# Patient Record
Sex: Female | Born: 1980 | Race: White | Hispanic: No | Marital: Married | Smoking: Never smoker
Health system: Southern US, Community
[De-identification: ages and names within clinical notes are randomized; demographics above are authoritative.]

## PROBLEM LIST (undated history)

## (undated) DIAGNOSIS — K259 Gastric ulcer, unspecified as acute or chronic, without hemorrhage or perforation: Secondary | ICD-10-CM

## (undated) DIAGNOSIS — D649 Anemia, unspecified: Secondary | ICD-10-CM

## (undated) DIAGNOSIS — M797 Fibromyalgia: Secondary | ICD-10-CM

## (undated) HISTORY — DX: Fibromyalgia: M79.7

## (undated) HISTORY — PX: DILATION AND CURETTAGE OF UTERUS: SHX78

## (undated) HISTORY — PX: CHOLECYSTECTOMY: SHX55

## (undated) HISTORY — DX: Anemia, unspecified: D64.9

## (undated) HISTORY — PX: GASTRIC BYPASS: SHX52

---

## 2003-11-01 ENCOUNTER — Emergency Department (HOSPITAL_COMMUNITY): Admission: EM | Admit: 2003-11-01 | Discharge: 2003-11-02 | Payer: Self-pay | Admitting: Emergency Medicine

## 2003-11-22 ENCOUNTER — Emergency Department (HOSPITAL_COMMUNITY): Admission: EM | Admit: 2003-11-22 | Discharge: 2003-11-22 | Payer: Self-pay | Admitting: Emergency Medicine

## 2004-02-25 ENCOUNTER — Emergency Department (HOSPITAL_COMMUNITY): Admission: EM | Admit: 2004-02-25 | Discharge: 2004-02-25 | Payer: Self-pay | Admitting: Emergency Medicine

## 2010-02-18 HISTORY — PX: GALLBLADDER SURGERY: SHX652

## 2014-03-26 ENCOUNTER — Emergency Department (HOSPITAL_COMMUNITY): Payer: Self-pay

## 2014-03-26 ENCOUNTER — Emergency Department (HOSPITAL_COMMUNITY)
Admission: EM | Admit: 2014-03-26 | Discharge: 2014-03-26 | Disposition: A | Discharge status: Home / Self Care | Payer: Self-pay | Attending: Emergency Medicine | Admitting: Emergency Medicine

## 2014-03-26 ENCOUNTER — Encounter (HOSPITAL_COMMUNITY): Payer: Self-pay | Admitting: Emergency Medicine

## 2014-03-26 DIAGNOSIS — M549 Dorsalgia, unspecified: Secondary | ICD-10-CM | POA: Insufficient documentation

## 2014-03-26 DIAGNOSIS — Z3202 Encounter for pregnancy test, result negative: Secondary | ICD-10-CM | POA: Insufficient documentation

## 2014-03-26 DIAGNOSIS — R110 Nausea: Secondary | ICD-10-CM | POA: Insufficient documentation

## 2014-03-26 DIAGNOSIS — R1010 Upper abdominal pain, unspecified: Secondary | ICD-10-CM | POA: Insufficient documentation

## 2014-03-26 DIAGNOSIS — Z9049 Acquired absence of other specified parts of digestive tract: Secondary | ICD-10-CM | POA: Insufficient documentation

## 2014-03-26 LAB — CBC WITH DIFFERENTIAL/PLATELET
Basophils Absolute: 0 10*3/uL (ref 0.0–0.1)
Basophils Relative: 0 % (ref 0–1)
Eosinophils Absolute: 0.1 10*3/uL (ref 0.0–0.7)
Eosinophils Relative: 2 % (ref 0–5)
HCT: 35.4 % — ABNORMAL LOW (ref 36.0–46.0)
Hemoglobin: 12 g/dL (ref 12.0–15.0)
Lymphocytes Relative: 38 % (ref 12–46)
Lymphs Abs: 2.2 10*3/uL (ref 0.7–4.0)
MCH: 30.6 pg (ref 26.0–34.0)
MCHC: 33.9 g/dL (ref 30.0–36.0)
MCV: 90.3 fL (ref 78.0–100.0)
Monocytes Absolute: 0.5 10*3/uL (ref 0.1–1.0)
Monocytes Relative: 8 % (ref 3–12)
Neutro Abs: 3 10*3/uL (ref 1.7–7.7)
Neutrophils Relative %: 52 % (ref 43–77)
Platelets: 272 10*3/uL (ref 150–400)
RBC: 3.92 MIL/uL (ref 3.87–5.11)
RDW: 12.9 % (ref 11.5–15.5)
WBC: 5.8 10*3/uL (ref 4.0–10.5)

## 2014-03-26 LAB — COMPREHENSIVE METABOLIC PANEL
ALT: 15 U/L (ref 0–35)
AST: 24 U/L (ref 0–37)
Albumin: 4.4 g/dL (ref 3.5–5.2)
Alkaline Phosphatase: 70 U/L (ref 39–117)
Anion gap: 5 (ref 5–15)
BUN: 8 mg/dL (ref 6–23)
CO2: 28 mmol/L (ref 19–32)
Calcium: 8.9 mg/dL (ref 8.4–10.5)
Chloride: 107 mmol/L (ref 96–112)
Creatinine, Ser: 0.74 mg/dL (ref 0.50–1.10)
GFR calc Af Amer: >90 mL/min (ref >90)
GFR calc non Af Amer: >90 mL/min (ref >90)
Glucose, Bld: 98 mg/dL (ref 70–99)
Potassium: 3.6 mmol/L (ref 3.5–5.1)
Sodium: 140 mmol/L (ref 135–145)
Total Bilirubin: 0.7 mg/dL (ref 0.3–1.2)
Total Protein: 7.1 g/dL (ref 6.0–8.3)

## 2014-03-26 LAB — URINALYSIS, ROUTINE W REFLEX MICROSCOPIC
Bilirubin Urine: NEGATIVE
Glucose, UA: NEGATIVE mg/dL
Hgb urine dipstick: NEGATIVE
Ketones, ur: 15 mg/dL — AB
Leukocytes, UA: NEGATIVE
Nitrite: NEGATIVE
Protein, ur: NEGATIVE mg/dL
Specific Gravity, Urine: 1.02 (ref 1.005–1.030)
Urobilinogen, UA: 1 mg/dL (ref 0.0–1.0)
pH: 6.5 (ref 5.0–8.0)

## 2014-03-26 LAB — PREGNANCY, URINE: Preg Test, Ur: NEGATIVE

## 2014-03-26 LAB — I-STAT CG4 LACTIC ACID, ED: Lactic Acid, Venous: 0.94 mmol/L (ref 0.5–2.0)

## 2014-03-26 LAB — LIPASE, BLOOD: Lipase: 38 U/L (ref 11–59)

## 2014-03-26 MED ORDER — KETOROLAC TROMETHAMINE 30 MG/ML IJ SOLN
30.0000 mg | Freq: Once | INTRAMUSCULAR | Status: AC
  Administered 2014-03-26: 30 mg via INTRAVENOUS
  Filled 2014-03-26: qty 1

## 2014-03-26 MED ORDER — SODIUM CHLORIDE 0.9 % IV BOLUS (SEPSIS)
1000.0000 mL | Freq: Once | INTRAVENOUS | Status: AC
  Administered 2014-03-26: 1000 mL via INTRAVENOUS

## 2014-03-26 MED ORDER — SUCRALFATE 1 G PO TABS: 1.0000 g | ORAL_TABLET | Freq: Four times a day (QID) | ORAL | Status: AC

## 2014-03-26 MED ORDER — DICYCLOMINE HCL 10 MG/ML IM SOLN
20.0000 mg | Freq: Once | INTRAMUSCULAR | Status: AC
  Administered 2014-03-26: 20 mg via INTRAMUSCULAR
  Filled 2014-03-26: qty 2

## 2014-03-26 MED ORDER — ONDANSETRON HCL 4 MG/2ML IJ SOLN
4.0000 mg | Freq: Once | INTRAMUSCULAR | Status: AC
  Administered 2014-03-26: 4 mg via INTRAVENOUS
  Filled 2014-03-26: qty 2

## 2014-03-26 MED ORDER — GI COCKTAIL ~~LOC~~
10.0000 mL | Freq: Once | ORAL | Status: AC
  Administered 2014-03-26: 10 mL via ORAL
  Filled 2014-03-26: qty 30

## 2014-03-26 MED ORDER — MORPHINE SULFATE 4 MG/ML IJ SOLN
4.0000 mg | Freq: Once | INTRAMUSCULAR | Status: AC
  Administered 2014-03-26: 4 mg via INTRAVENOUS
  Filled 2014-03-26: qty 1

## 2014-03-26 MED ORDER — MORPHINE SULFATE 4 MG/ML IJ SOLN
4.0000 mg | Freq: Once | INTRAMUSCULAR | Status: AC
Start: 2014-03-26 — End: 2014-03-26
  Administered 2014-03-26: 4 mg via INTRAVENOUS
  Filled 2014-03-26: qty 1

## 2014-03-26 MED ORDER — SUCRALFATE 1 G PO TABS
1.0000 g | ORAL_TABLET | Freq: Once | ORAL | Status: AC
  Administered 2014-03-26: 1 g via ORAL
  Filled 2014-03-26: qty 1

## 2014-03-26 NOTE — Discharge Instructions (Signed)
Your workup today has not shown a specific cause for your symptoms.  Please follow-up with your gastroenterologist as scheduled.  Take medications as prescribed.  Return to the emergency department for worsening condition or new concerning symptoms.   Abdominal Pain Many things can cause abdominal pain. Usually, abdominal pain is not caused by a disease and will improve without treatment. It can often be observed and treated at home. Your health care provider will do a physical exam and possibly order blood tests and X-rays to help determine the seriousness of your pain. However, in many cases, more time must pass before a clear cause of the pain can be found. Before that point, your health care provider may not know if you need more testing or further treatment. HOME CARE INSTRUCTIONS  Monitor your abdominal pain for any changes. The following actions may help to alleviate any discomfort you are experiencing:  Only take over-the-counter or prescription medicines as directed by your health care provider.  Do not take laxatives unless directed to do so by your health care provider.  Try a clear liquid diet (broth, tea, or water) as directed by your health care provider. Slowly move to a bland diet as tolerated. SEEK MEDICAL CARE IF:  You have unexplained abdominal pain.  You have abdominal pain associated with nausea or diarrhea.  You have pain when you urinate or have a bowel movement.  You experience abdominal pain that wakes you in the night.  You have abdominal pain that is worsened or improved by eating food.  You have abdominal pain that is worsened with eating fatty foods.  You have a fever. SEEK IMMEDIATE MEDICAL CARE IF:   Your pain does not go away within 2 hours.  You keep throwing up (vomiting).  Your pain is felt only in portions of the abdomen, such as the right side or the left lower portion of the abdomen.  You pass bloody or black tarry stools. MAKE SURE  YOU:  Understand these instructions.   Will watch your condition.   Will get help right away if you are not doing well or get worse.  Document Released: 11/14/2004 Document Revised: 02/09/2013 Document Reviewed: 10/14/2012 [facility] Patient Information 2015 [place], [place]. This information is not intended to replace advice given to you by your health care provider. Make sure you discuss any questions you have with your health care provider.

## 2014-03-26 NOTE — ED Notes (Signed)
Pt. reports right abdominal pain radiating to right lower back with nausea and diarrhea x3 today . Denies fever or chills, seen by her GI doctor yesterday and scheduled for EGD .

## 2014-03-26 NOTE — ED Provider Notes (Signed)
CSN: [id number]     Arrival date & time 03/26/14  0028 History   First MD Initiated Contact with Patient 03/26/14 0056     This chart was scribed for Olivia Mackie, MD by Arlan Organ, ED Scribe. This patient was seen in room A07C/A07C and the patient's care was started 1:00 AM.   Chief Complaint  Patient presents with  . Abdominal Pain  . Back Pain  . Diarrhea   HPI  HPI Comments: Cheyenne Murillo is a 34 y.o. 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 However, symptoms have worsened this evening. Pt also reports nausea and diarrhea x 3 days. Last bowel movement today. She denies any fever or dysuria. Last abdominal CT scan performed at [facility] approximately 2 weeks ago. 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 No history of mitral valve prolapse. History of kidney stone in 2013 after initial bypass surgery. LNMP 2 days ago. Pt was seen by her GI physician yesterday and now scheduled for a EGD. Pt with known allergy to Codeine.   She is followed [facility] [place]  Gastric Bypass revision performed by Dr. Haynes Hoehn at [facility]  History reviewed. No pertinent past medical history. Past Surgical History  Procedure Laterality Date  . Gastric bypass    . Cholecystectomy    . Dilation and curettage of uterus     No family history on file. History  Substance Use Topics  . Smoking status: Never Smoker   . Smokeless tobacco: Not on file  . Alcohol Use: No   OB History    No data available     Review of Systems  Gastrointestinal: Positive for nausea, abdominal pain and diarrhea.  Musculoskeletal: Positive for back pain.  All other systems reviewed and are negative.     Allergies  Codeine  Home  Medications   Prior to Admission medications   Not on File   Triage Vitals: BP 119/84 mmHg  Pulse 95  Temp(Src) 98.2 F (36.8 C) (Oral)  Resp 20  Ht  (1.6 m)  Wt 130 lb (58.968 kg)  BMI 23.03 kg/m2  SpO2 100%  LMP 02/15/2014   Physical Exam  Constitutional: She is oriented to person, place, and time. She appears well-developed and well-nourished. No distress.  HENT:  Head: Normocephalic and atraumatic.  Eyes: EOM are normal.  Neck: Normal range of motion.  Cardiovascular: Normal rate, regular rhythm and normal heart sounds.   Pulmonary/Chest: Effort normal and breath sounds normal.  Abdominal: Soft. She exhibits no distension. There is no tenderness.  Musculoskeletal: Normal range of motion.  Neurological: She is alert and oriented to person, place, and time.  Skin: Skin is warm and dry.  Psychiatric: She has a normal mood and affect. Judgment normal.  Nursing note and vitals reviewed.   ED Course  Procedures (including critical care time)  DIAGNOSTIC STUDIES: Oxygen Saturation is 100% on RA, Normal by my interpretation.    COORDINATION OF CARE: 1:16 AM-Discussed treatment plan with pt at bedside and pt agreed to plan.     Labs Review Labs Reviewed  CBC WITH DIFFERENTIAL/PLATELET - Abnormal; Notable for the following:    HCT 35.4 (*)    All other components within normal limits  URINALYSIS, ROUTINE W REFLEX MICROSCOPIC - Abnormal; Notable for the following:    Ketones, ur 15 (*)    All other components within normal limits  COMPREHENSIVE METABOLIC PANEL  LIPASE, BLOOD  PREGNANCY, URINE  I-STAT CG4 LACTIC ACID, ED    Imaging Review Dg Abd Acute W/chest  03/26/2014   CLINICAL DATA:  Abdominal pain for 2 weeks  EXAM: ACUTE ABDOMEN SERIES (ABDOMEN 2 VIEW & CHEST 1 VIEW)  COMPARISON:  None.  FINDINGS: The upright chest x-ray is normal.  Two views of the abdomen demonstrate moderate stool and air throughout the colon and down into the rectosigmoid area. There  are surgical changes related to bypass surgery. Artifact from a button is noted. No findings for small bowel obstruction or free air. The soft tissue shadows are grossly maintained. The bony structures are unremarkable.  IMPRESSION: No acute cardiopulmonary findings.  Postoperative changes. No findings for small bowel obstruction or free air.   Electronically Signed   By: Loralie ChampagneMark  Gallerani M.D.   On: 03/26/2014 02:16     EKG Interpretation None      MDM   Final diagnoses:  Upper abdominal pain    34 year old female with acute on chronic abdominal pain worse than her norm with nausea and vomiting.  Patient has history of Roux-en-Y bypass.  Seen today by her gastroenterologist and is scheduled for endoscopy.  Patient reports recent CT scan in the emergency room about 2 weeks ago.  She has had a bowel movement was last 24 hours.  Differential includes small bowel obstruction, nephrolithiasis, ectopic pregnancy, gastritis, enteritis.  Plan for labs and acute abdominal series to start the workup.  Will treat with pain and nausea medications.  Will attempt to get records from outside hospital.  I personally performed the services described in this documentation, which was scribed in my presence. The recorded information has been reviewed and is accurate.  Patient reports nausea is better, pain is slightly better.  Labs here without significant findings aside from ketones in urine.  Outside records received, patient had normal labs and CT scan done on the 21st.  X-ray here without obstruction.  Patient has close follow-up with GI scheduled.  4:57 AM Patient reports pain has improved.  She is ready for discharge home.  Olivia Mackielga M Jillene Wehrenberg, MD 03/26/14 [phone]

## 2014-06-13 ENCOUNTER — Emergency Department (HOSPITAL_COMMUNITY)
Admission: EM | Admit: 2014-06-13 | Discharge: 2014-06-13 | Disposition: A | Discharge status: Home / Self Care | Payer: BLUE CROSS/BLUE SHIELD | Attending: Emergency Medicine | Admitting: Emergency Medicine

## 2014-06-13 ENCOUNTER — Encounter (HOSPITAL_COMMUNITY): Payer: Self-pay | Admitting: Emergency Medicine

## 2014-06-13 ENCOUNTER — Emergency Department (HOSPITAL_COMMUNITY): Payer: BLUE CROSS/BLUE SHIELD

## 2014-06-13 DIAGNOSIS — Z8719 Personal history of other diseases of the digestive system: Secondary | ICD-10-CM | POA: Insufficient documentation

## 2014-06-13 DIAGNOSIS — Z3202 Encounter for pregnancy test, result negative: Secondary | ICD-10-CM | POA: Diagnosis not present

## 2014-06-13 DIAGNOSIS — Z79899 Other long term (current) drug therapy: Secondary | ICD-10-CM | POA: Diagnosis not present

## 2014-06-13 DIAGNOSIS — R112 Nausea with vomiting, unspecified: Secondary | ICD-10-CM | POA: Diagnosis not present

## 2014-06-13 DIAGNOSIS — R1084 Generalized abdominal pain: Secondary | ICD-10-CM | POA: Insufficient documentation

## 2014-06-13 DIAGNOSIS — R6883 Chills (without fever): Secondary | ICD-10-CM | POA: Diagnosis not present

## 2014-06-13 DIAGNOSIS — Z9049 Acquired absence of other specified parts of digestive tract: Secondary | ICD-10-CM | POA: Diagnosis not present

## 2014-06-13 DIAGNOSIS — R197 Diarrhea, unspecified: Secondary | ICD-10-CM | POA: Diagnosis not present

## 2014-06-13 DIAGNOSIS — R109 Unspecified abdominal pain: Secondary | ICD-10-CM

## 2014-06-13 HISTORY — DX: Gastric ulcer, unspecified as acute or chronic, without hemorrhage or perforation: K25.9

## 2014-06-13 LAB — CBC WITH DIFFERENTIAL/PLATELET
BASOS ABS: 0 10*3/uL (ref 0.0–0.1)
BASOS PCT: 0 % (ref 0–1)
EOS PCT: 1 % (ref 0–5)
Eosinophils Absolute: 0.1 10*3/uL (ref 0.0–0.7)
HEMATOCRIT: 36.3 % (ref 36.0–46.0)
Hemoglobin: 12.1 g/dL (ref 12.0–15.0)
Lymphocytes Relative: 27 % (ref 12–46)
Lymphs Abs: 2.3 10*3/uL (ref 0.7–4.0)
MCH: 29.3 pg (ref 26.0–34.0)
MCHC: 33.3 g/dL (ref 30.0–36.0)
MCV: 87.9 fL (ref 78.0–100.0)
MONOS PCT: 7 % (ref 3–12)
Monocytes Absolute: 0.6 10*3/uL (ref 0.1–1.0)
Neutro Abs: 5.6 10*3/uL (ref 1.7–7.7)
Neutrophils Relative %: 65 % (ref 43–77)
Platelets: 251 10*3/uL (ref 150–400)
RBC: 4.13 MIL/uL (ref 3.87–5.11)
RDW: 13.8 % (ref 11.5–15.5)
WBC: 8.6 10*3/uL (ref 4.0–10.5)

## 2014-06-13 LAB — BASIC METABOLIC PANEL
ANION GAP: 10 (ref 5–15)
BUN: 7 mg/dL (ref 6–23)
CO2: 24 mmol/L (ref 19–32)
CREATININE: 0.77 mg/dL (ref 0.50–1.10)
Calcium: 9.2 mg/dL (ref 8.4–10.5)
Chloride: 104 mmol/L (ref 96–112)
GFR calc Af Amer: >90 mL/min (ref >90)
GFR calc non Af Amer: >90 mL/min (ref >90)
GLUCOSE: 104 mg/dL — AB (ref 70–99)
Potassium: 3.4 mmol/L — ABNORMAL LOW (ref 3.5–5.1)
SODIUM: 138 mmol/L (ref 135–145)

## 2014-06-13 LAB — URINALYSIS, ROUTINE W REFLEX MICROSCOPIC
Bilirubin Urine: NEGATIVE
Glucose, UA: NEGATIVE mg/dL
Hgb urine dipstick: NEGATIVE
KETONES UR: 40 mg/dL — AB
Leukocytes, UA: NEGATIVE
Nitrite: NEGATIVE
Protein, ur: NEGATIVE mg/dL
Specific Gravity, Urine: 1.019 (ref 1.005–1.030)
Urobilinogen, UA: 0.2 mg/dL (ref 0.0–1.0)
pH: 6.5 (ref 5.0–8.0)

## 2014-06-13 LAB — URINE MICROSCOPIC-ADD ON

## 2014-06-13 LAB — HEPATIC FUNCTION PANEL
ALT: 12 U/L (ref 0–35)
AST: 19 U/L (ref 0–37)
Albumin: 3.6 g/dL (ref 3.5–5.2)
Alkaline Phosphatase: 55 U/L (ref 39–117)
BILIRUBIN DIRECT: 0.1 mg/dL (ref 0.0–0.5)
BILIRUBIN INDIRECT: 0.6 mg/dL (ref 0.3–0.9)
TOTAL PROTEIN: 5.8 g/dL — AB (ref 6.0–8.3)
Total Bilirubin: 0.7 mg/dL (ref 0.3–1.2)

## 2014-06-13 LAB — LIPASE, BLOOD: LIPASE: 31 U/L (ref 11–59)

## 2014-06-13 LAB — POC URINE PREG, ED: Preg Test, Ur: NEGATIVE

## 2014-06-13 MED ORDER — IOHEXOL 300 MG/ML  SOLN
25.0000 mL | INTRAMUSCULAR | Status: AC
  Administered 2014-06-13: 25 mL via ORAL

## 2014-06-13 MED ORDER — PANTOPRAZOLE SODIUM 40 MG IV SOLR
40.0000 mg | Freq: Once | INTRAVENOUS | Status: AC
  Administered 2014-06-13: 40 mg via INTRAVENOUS
  Filled 2014-06-13: qty 40

## 2014-06-13 MED ORDER — ONDANSETRON HCL 4 MG/2ML IJ SOLN
4.0000 mg | Freq: Once | INTRAMUSCULAR | Status: AC
  Administered 2014-06-13: 4 mg via INTRAVENOUS

## 2014-06-13 MED ORDER — DICYCLOMINE HCL 20 MG PO TABS: 20.0000 mg | ORAL_TABLET | Freq: Two times a day (BID) | ORAL | Status: AC

## 2014-06-13 MED ORDER — PROMETHAZINE HCL 25 MG/ML IJ SOLN
25.0000 mg | Freq: Once | INTRAMUSCULAR | Status: AC
  Administered 2014-06-13: 25 mg via INTRAVENOUS
  Filled 2014-06-13: qty 1

## 2014-06-13 MED ORDER — ONDANSETRON HCL 4 MG PO TABS: 4.0000 mg | ORAL_TABLET | Freq: Four times a day (QID) | ORAL | Status: AC

## 2014-06-13 MED ORDER — IOHEXOL 300 MG/ML  SOLN
100.0000 mL | Freq: Once | INTRAMUSCULAR | Status: AC | PRN
  Administered 2014-06-13: 100 mL via INTRAVENOUS

## 2014-06-13 MED ORDER — ONDANSETRON HCL 4 MG/2ML IJ SOLN
4.0000 mg | Freq: Once | INTRAMUSCULAR | Status: AC
  Administered 2014-06-13: 4 mg via INTRAVENOUS
  Filled 2014-06-13: qty 2

## 2014-06-13 MED ORDER — HYDROMORPHONE HCL 1 MG/ML IJ SOLN
0.5000 mg | Freq: Once | INTRAMUSCULAR | Status: AC
  Administered 2014-06-13: 0.5 mg via INTRAVENOUS
  Filled 2014-06-13: qty 1

## 2014-06-13 MED ORDER — ONDANSETRON HCL 4 MG/2ML IJ SOLN
INTRAMUSCULAR | Status: AC
  Filled 2014-06-13: qty 2

## 2014-06-13 MED ORDER — SODIUM CHLORIDE 0.9 % IV BOLUS (SEPSIS)
2000.0000 mL | Freq: Once | INTRAVENOUS | Status: AC
  Administered 2014-06-13: 2000 mL via INTRAVENOUS

## 2014-06-13 MED ORDER — SODIUM CHLORIDE 0.9 % IV SOLN
1000.0000 mL | Freq: Once | INTRAVENOUS | Status: AC
  Administered 2014-06-13: 1000 mL via INTRAVENOUS

## 2014-06-13 NOTE — ED Notes (Signed)
Pt states diarrhea started Thursday, abdominal pain started Friday, N/V began Friday night.  Pt reports hx stomach ulcers. Pt reports using carafate and protonix but unable to keep them down.  Pt reports unable to keep down liquids or food for 48 hours.

## 2014-06-13 NOTE — ED Provider Notes (Signed)
CSN: [id number]     Arrival date & time 06/13/14  0358 History   First MD Initiated Contact with Patient 06/13/14 0457     Chief Complaint  Patient presents with  . Abdominal Pain  . Nausea     (Consider location/radiation/quality/duration/timing/severity/associated sxs/prior Treatment) HPI Results with several days of upper abdominal pain. She states it worsened over the last day. She's had multiple abscesses of nausea and vomiting as well as diarrhea. She denies any fevers. Patient with history of bypass surgery in 2018 with revision in 2014. Had recent endoscopy done by her gastroenterologist to reveal duodenal ulcer. She's been started on Carafate) hypertonic sensations but compliant with his medications. No recent dietary changes. Denies taking NSAIDs. Past Medical History  Diagnosis Date  . Multiple gastric ulcers    Past Surgical History  Procedure Laterality Date  . Gastric bypass    . Cholecystectomy    . Dilation and curettage of uterus     No family history on file. History  Substance Use Topics  . Smoking status: Never Smoker   . Smokeless tobacco: Not on file  . Alcohol Use: No   OB History    No data available     Review of Systems  Constitutional: Positive for chills. Negative for fever.  Respiratory: Negative for cough and shortness of breath.   Cardiovascular: Negative for chest pain.  Gastrointestinal: Positive for nausea, vomiting, abdominal pain and diarrhea. Negative for constipation and blood in stool.  Genitourinary: Negative for dysuria and flank pain.  Musculoskeletal: Negative for back pain, neck pain and neck stiffness.  Skin: Negative for rash and wound.  Neurological: Negative for dizziness, weakness, light-headedness, numbness and headaches.  All other systems reviewed and are negative.     Allergies  Codeine  Home Medications   Prior to Admission medications   Medication Sig Start Date End Date Taking? Authorizing Provider   pantoprazole (PROTONIX) 40 MG tablet Take 40 mg by mouth daily.   Yes Historical Provider, MD  Prenatal Vit-Fe Fumarate-FA (PRENATAL MULTIVITAMIN) TABS tablet Take 1 tablet by mouth daily at 12 noon.   Yes Historical Provider, MD  sucralfate (CARAFATE) 1 G tablet Take 1 tablet (1 g total) by mouth 4 (four) times daily. 03/26/14  Yes Marisa Severin, MD  dicyclomine (BENTYL) 20 MG tablet Take 1 tablet (20 mg total) by mouth 2 (two) times daily. 06/13/14   Glynn Octave, MD  ondansetron (ZOFRAN) 4 MG tablet Take 1 tablet (4 mg total) by mouth every 6 (six) hours. 06/13/14   Glynn Octave, MD   BP 92/64 mmHg  Pulse 60  Temp(Src) 98.5 F (36.9 C) (Oral)  Resp 16  Ht  (1.6 m)  Wt 130 lb (58.968 kg)  BMI 23.03 kg/m2  SpO2 100%  LMP 05/06/2014 Physical Exam  Constitutional: She is oriented to person, place, and time. She appears well-developed and well-nourished. She appears distressed.  HENT:  Head: Normocephalic and atraumatic.  Mouth/Throat: Oropharynx is clear and moist. No oropharyngeal exudate.  Eyes: EOM are normal. Pupils are equal, round, and reactive to light.  Neck: Normal range of motion. Neck supple.  Cardiovascular: Normal rate and regular rhythm.   Pulmonary/Chest: Effort normal and breath sounds normal. No respiratory distress. She has no wheezes. She has no rales. She exhibits no tenderness.  Abdominal: Soft. Bowel sounds are normal. She exhibits no distension and no mass. There is tenderness (diffuse abdominal tenderness especially in the upper abdomen.). There is no rebound and no guarding.  Musculoskeletal: Normal range of motion. She exhibits no edema or tenderness.  No CVA tenderness bilaterally.  Neurological: She is alert and oriented to person, place, and time.  Moves all extremities without deficit. Sensation is grossly intact.  Skin: Skin is warm and dry. No rash noted. No erythema.  Psychiatric: She has a normal mood and affect. Her behavior is normal.  Nursing  note and vitals reviewed.   ED Course  Procedures (including critical care time) Labs Review Labs Reviewed  BASIC METABOLIC PANEL - Abnormal; Notable for the following:    Potassium 3.4 (*)    Glucose, Bld 104 (*)    All other components within normal limits  HEPATIC FUNCTION PANEL - Abnormal; Notable for the following:    Total Protein 5.8 (*)    All other components within normal limits  URINALYSIS, ROUTINE W REFLEX MICROSCOPIC - Abnormal; Notable for the following:    APPearance TURBID (*)    Ketones, ur 40 (*)    All other components within normal limits  URINE MICROSCOPIC-ADD ON - Abnormal; Notable for the following:    Squamous Epithelial / LPF MANY (*)    Bacteria, UA MANY (*)    All other components within normal limits  CBC WITH DIFFERENTIAL/PLATELET  LIPASE, BLOOD  POC URINE PREG, ED    Imaging Review No results found.   EKG Interpretation   Date/Time:  Monday June 13 2014 04:13:05 EDT Ventricular Rate:  89 PR Interval:  156 QRS Duration: 80 QT Interval:  362 QTC Calculation: 440 R Axis:   67 Text Interpretation:  Sinus rhythm ST elev, probable normal early repol  pattern No previous ECGs available Confirmed by RANCOUR  MD, STEPHEN  [phone]([id number]) on 06/13/2014 10:34:53 AM      MDM   Final diagnoses:  Abdominal pain  Nausea and vomiting, vomiting of unspecified type    We'll treat symptomatically. Also get CT scan to rule out intra-abdominal pathology.  Signed out to oncoming emergency physician pending CT scan results.  Loren Raceravid Laquia Rosano, MD 06/22/14 [phone]

## 2014-06-13 NOTE — ED Provider Notes (Signed)
Care assumed from Dr. Ranae PalmsYelverton.  CT pending for upper abdominal pain.  Hx duodenal ulcers and gastric bypass. LFTs and lipase normal.  CT without acute findings.  Patient tolerating PO. Symptomatic care for home, continue PPI. followup with GI and PCP. BP 92/64 mmHg  Pulse 60  Temp(Src) 98.5 F (36.9 C) (Oral)  Resp 16  Ht 5\' 3"  (1.6 m)  Wt 130 lb (58.968 kg)  BMI 23.03 kg/m2  SpO2 100%  LMP 05/06/2014   Cheyenne OctaveStephen Sheika Coutts, MD 06/13/14 [id number]

## 2014-06-13 NOTE — Discharge Instructions (Signed)
Nausea and Vomiting Follow-up with her stomach doctor. Take the nausea medicine as prescribed. Return to the ED if you develop new or worsening symptoms. Nausea is a sick feeling that often comes before throwing up (vomiting). Vomiting is a reflex where stomach contents come out of your mouth. Vomiting can cause severe loss of body fluids (dehydration). Children and elderly adults can become dehydrated quickly, especially if they also have diarrhea. Nausea and vomiting are symptoms of a condition or disease. It is important to find the cause of your symptoms. CAUSES   Direct irritation of the stomach lining. This irritation can result from increased acid production (gastroesophageal reflux disease), infection, food poisoning, taking certain medicines (such as nonsteroidal anti-inflammatory drugs), alcohol use, or tobacco use.  Signals from the brain.These signals could be caused by a headache, heat exposure, an inner ear disturbance, increased pressure in the brain from injury, infection, a tumor, or a concussion, pain, emotional stimulus, or metabolic problems.  An obstruction in the gastrointestinal tract (bowel obstruction).  Illnesses such as diabetes, hepatitis, gallbladder problems, appendicitis, kidney problems, cancer, sepsis, atypical symptoms of a heart attack, or eating disorders.  Medical treatments such as chemotherapy and radiation.  Receiving medicine that makes you sleep (general anesthetic) during surgery. DIAGNOSIS Your caregiver may ask for tests to be done if the problems do not improve after a few days. Tests may also be done if symptoms are severe or if the reason for the nausea and vomiting is not clear. Tests may include:  Urine tests.  Blood tests.  Stool tests.  Cultures (to look for evidence of infection).  X-rays or other imaging studies. Test results can help your caregiver make decisions about treatment or the need for additional tests. TREATMENT You need  to stay well hydrated. Drink frequently but in small amounts.You may wish to drink water, sports drinks, clear broth, or eat frozen ice pops or gelatin dessert to help stay hydrated.When you eat, eating slowly may help prevent nausea.There are also some antinausea medicines that may help prevent nausea. HOME CARE INSTRUCTIONS   Take all medicine as directed by your caregiver.  If you do not have an appetite, do not force yourself to eat. However, you must continue to drink fluids.  If you have an appetite, eat a normal diet unless your caregiver tells you differently.  Eat a variety of complex carbohydrates (rice, wheat, potatoes, bread), lean meats, yogurt, fruits, and vegetables.  Avoid high-fat foods because they are more difficult to digest.  Drink enough water and fluids to keep your urine clear or pale yellow.  If you are dehydrated, ask your caregiver for specific rehydration instructions. Signs of dehydration may include:  Severe thirst.  Dry lips and mouth.  Dizziness.  Dark urine.  Decreasing urine frequency and amount.  Confusion.  Rapid breathing or pulse. SEEK IMMEDIATE MEDICAL CARE IF:   You have blood or brown flecks (like coffee grounds) in your vomit.  You have black or bloody stools.  You have a severe headache or stiff neck.  You are confused.  You have severe abdominal pain.  You have chest pain or trouble breathing.  You do not urinate at least once every 8 hours.  You develop cold or clammy skin.  You continue to vomit for longer than 24 to 48 hours.  You have a fever. MAKE SURE YOU:   Understand these instructions.  Will watch your condition.  Will get help right away if you are not doing well or  get worse. Document Released: 02/04/2005 Document Revised: 04/29/2011 Document Reviewed: 07/04/2010 [facility] Patient Information 2015 [place], [place]. This information is not intended to replace advice given to you by your health care  provider. Make sure you discuss any questions you have with your health care provider.

## 2014-08-27 ENCOUNTER — Emergency Department (INDEPENDENT_AMBULATORY_CARE_PROVIDER_SITE_OTHER): Payer: BLUE CROSS/BLUE SHIELD

## 2014-08-27 ENCOUNTER — Encounter (HOSPITAL_COMMUNITY): Payer: Self-pay | Admitting: Emergency Medicine

## 2014-08-27 ENCOUNTER — Emergency Department (INDEPENDENT_AMBULATORY_CARE_PROVIDER_SITE_OTHER)
Admission: EM | Admit: 2014-08-27 | Discharge: 2014-08-27 | Disposition: A | Discharge status: Home / Self Care | Payer: Self-pay | Source: Home / Self Care

## 2014-08-27 DIAGNOSIS — R1010 Upper abdominal pain, unspecified: Secondary | ICD-10-CM

## 2014-08-27 DIAGNOSIS — S20211A Contusion of right front wall of thorax, initial encounter: Secondary | ICD-10-CM | POA: Diagnosis not present

## 2014-08-27 DIAGNOSIS — R109 Unspecified abdominal pain: Secondary | ICD-10-CM

## 2014-08-27 MED ORDER — HYDROCODONE-ACETAMINOPHEN 5-325 MG PO TABS: 1.0000 | ORAL_TABLET | Freq: Four times a day (QID) | ORAL | Status: DC | PRN

## 2014-08-27 NOTE — ED Provider Notes (Signed)
CSN: [id number]     Arrival date & time 08/27/14  1757 History   First MD Initiated Contact with Patient 08/27/14 1920     Chief Complaint  Patient presents with  . Back Pain    Patient is a 34 y.o. female presenting with back pain. The history is provided by the patient.  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 Her [place].  Patient fell down 4 steps yesterday landing on her right side. She now has pain in the right ribs and this is made worse by raising her right arm. She did not injure her hip, knee, ankle, or hands. She had no loss of consciousness. Past Medical History  Diagnosis Date  . Multiple gastric ulcers    Past Surgical History  Procedure Laterality Date  . Gastric bypass    . Cholecystectomy    . Dilation and curettage of uterus     History reviewed. No pertinent family history. History  Substance Use Topics  . Smoking status: Never Smoker   . Smokeless tobacco: Not on file  . Alcohol Use: No   OB History    No data available     Review of Systems  Constitutional: Negative.   HENT: Negative.   Eyes: Negative.   Respiratory: Negative.   Cardiovascular: Positive for chest pain.  Gastrointestinal: Negative.   Musculoskeletal: Positive for myalgias and back pain.  Neurological: Negative.  Negative for syncope.  Psychiatric/Behavioral: Negative.  Negative for behavioral problems, confusion, dysphoric mood, decreased concentration and agitation.    Allergies  Codeine  Home Medications   Prior to Admission medications    Medication Sig Start Date End Date Taking? Authorizing Provider  dicyclomine (BENTYL) 20 MG tablet Take 1 tablet (20 mg total) by mouth 2 (two) times daily. 06/13/14   Glynn OctaveStephen Rancour, MD  ondansetron (ZOFRAN) 4 MG tablet Take 1 tablet (4 mg total) by mouth every 6 (six) hours. 06/13/14   Glynn OctaveStephen Rancour, MD  pantoprazole (PROTONIX) 40 MG tablet Take 40 mg by mouth daily.    Historical Provider, MD  Prenatal Vit-Fe Fumarate-FA (PRENATAL MULTIVITAMIN) TABS tablet Take 1 tablet by mouth daily at 12 noon.    Historical Provider, MD  sucralfate (CARAFATE) 1 G tablet Take 1 tablet (1 g total) by mouth 4 (four) times daily. 03/26/14   Marisa Severinlga Otter, MD   BP 105/62 mmHg  Pulse 89  Temp(Src) 99.2 F (37.3 C) (Oral)  Resp 16  SpO2 100%  LMP 08/19/2014 Physical Exam  Constitutional: She is oriented to person, place, and time. She appears well-developed and well-nourished.  HENT:  Head: Normocephalic and atraumatic.  Right Ear: External ear normal.  Left Ear: External ear normal.  Mouth/Throat: Oropharynx is clear and moist.  Cardiovascular: Normal rate, regular rhythm and normal heart sounds.   No murmur heard. Pulmonary/Chest: Effort normal and breath sounds normal.  Abdominal: Soft. There is tenderness.  Right upper quadrant pain which she says is chronic.  Musculoskeletal: Normal range of motion.  Neurological: She is alert and oriented to person, place, and  time.  Skin: Skin is warm and dry. No rash noted. No erythema.   Patient has right rib tenderness. ED Course  Procedures (including critical care time)  Imaging Review:  L-S spine films, preliminary reading:  Negative DG Lumbar Spine Complete (Final result) Result time: 08/27/14 19:57:34   Final result by Rad Results In Interface (08/27/14 19:57:34)   Narrative:   CLINICAL DATA: Acute low back pain after fall down steps yesterday. Initial encounter.  EXAM: LUMBAR SPINE - COMPLETE 4+ VIEW  COMPARISON: None.  FINDINGS: There  is no evidence of lumbar spine fracture. Alignment is normal. Intervertebral disc spaces are maintained. Posterior facet joints appear normal.  IMPRESSION: Normal lumbar spine.   Electronically Signed By: Lupita Raider, M.D. On: 08/27/2014 19:57          DG Ribs Unilateral W/Chest Right (Final result) Result time: 08/27/14 19:58:12   Final result by Rad Results In Interface (08/27/14 19:58:12)   Narrative:   CLINICAL DATA: Larey Seat down steps yesterday and injured right ribs.  EXAM: RIGHT RIBS AND CHEST - 3+ VIEW  COMPARISON: Chest x-ray 03/26/2014  FINDINGS: The cardiac silhouette, mediastinal and hilar contours are normal. The lungs are clear. No pleural effusion or pneumothorax.  Dedicated views of the right ribs do not demonstrate any definite acute rib fractures.  IMPRESSION: Normal chest x-ray.  No rib fractures.   Electronically Signed By: Rudie Meyer M.D. On: 08/27/2014 19:58      MDM   Recommend out of work for the next 2-3 days.    ICD-9-CM ICD-10-CM   1. Flank pain, acute 789.09 R10.10 HYDROcodone-acetaminophen (NORCO) 5-325 MG per tablet   338.19    2. Chest wall contusion, right, initial encounter 922.1 S20.211A HYDROcodone-acetaminophen (NORCO) 5-325 MG per tablet     Signed, Elvina Sidle, MD    Elvina Sidle, MD 08/27/14 2008

## 2014-08-27 NOTE — ED Notes (Signed)
C/o back pain States she slipped down stairs and hurt her back yesterday

## 2014-08-27 NOTE — Discharge Instructions (Signed)
DG Lumbar Spine Complete (Final result) Result time: 08/27/14 19:57:34   Final result by Rad Results In Interface (08/27/14 19:57:34)   Narrative:   CLINICAL DATA: Acute low back pain after fall down steps yesterday. Initial encounter.  EXAM: LUMBAR SPINE - COMPLETE 4+ VIEW  COMPARISON: None.  FINDINGS: There is no evidence of lumbar spine fracture. Alignment is normal. Intervertebral disc spaces are maintained. Posterior facet joints appear normal.  IMPRESSION: Normal lumbar spine.   Electronically Signed By: Lupita RaiderJames Green Jr, M.D. On: 08/27/2014 19:57          DG Ribs Unilateral W/Chest Right (Final result) Result time: 08/27/14 19:58:12   Final result by Rad Results In Interface (08/27/14 19:58:12)   Narrative:   CLINICAL DATA: Larey SeatFell down steps yesterday and injured right ribs.  EXAM: RIGHT RIBS AND CHEST - 3+ VIEW  COMPARISON: Chest x-ray 03/26/2014  FINDINGS: The cardiac silhouette, mediastinal and hilar contours are normal. The lungs are clear. No pleural effusion or pneumothorax.  Dedicated views of the right ribs do not demonstrate any definite acute rib fractures.  IMPRESSION: Normal chest x-ray.  No rib fractures.   Electronically Signed By: Rudie MeyerP. Gallerani M.D. On: 08/27/2014 19:58   Please return if symptoms are not resolving by Wednesday. I would recommend staying out of work until Wednesday as well. You may want to use some ibuprofen in addition to the hydrocodone that's been ordered.

## 2014-09-29 ENCOUNTER — Encounter: Payer: Self-pay | Admitting: Osteopathic Medicine

## 2014-09-29 ENCOUNTER — Ambulatory Visit (INDEPENDENT_AMBULATORY_CARE_PROVIDER_SITE_OTHER): Payer: BLUE CROSS/BLUE SHIELD | Admitting: Osteopathic Medicine

## 2014-09-29 VITALS — BP 102/64 | HR 96 | Ht 63.0 in | Wt 140.0 lb

## 2014-09-29 DIAGNOSIS — F419 Anxiety disorder, unspecified: Secondary | ICD-10-CM

## 2014-09-29 DIAGNOSIS — R52 Pain, unspecified: Secondary | ICD-10-CM | POA: Insufficient documentation

## 2014-09-29 DIAGNOSIS — Z8719 Personal history of other diseases of the digestive system: Secondary | ICD-10-CM | POA: Insufficient documentation

## 2014-09-29 DIAGNOSIS — G2581 Restless legs syndrome: Secondary | ICD-10-CM | POA: Insufficient documentation

## 2014-09-29 DIAGNOSIS — F329 Major depressive disorder, single episode, unspecified: Secondary | ICD-10-CM

## 2014-09-29 DIAGNOSIS — Z9884 Bariatric surgery status: Secondary | ICD-10-CM

## 2014-09-29 DIAGNOSIS — R5383 Other fatigue: Secondary | ICD-10-CM

## 2014-09-29 DIAGNOSIS — Z8711 Personal history of peptic ulcer disease: Secondary | ICD-10-CM | POA: Insufficient documentation

## 2014-09-29 DIAGNOSIS — K589 Irritable bowel syndrome without diarrhea: Secondary | ICD-10-CM | POA: Insufficient documentation

## 2014-09-29 DIAGNOSIS — F418 Other specified anxiety disorders: Secondary | ICD-10-CM

## 2014-09-29 MED ORDER — GABAPENTIN 300 MG PO CAPS: 300.0000 mg | ORAL_CAPSULE | Freq: Three times a day (TID) | ORAL | Status: DC

## 2014-09-29 NOTE — Progress Notes (Signed)
Subjective:    Patient ID: AMARII AMY, female    DOB: 10-14-1980, 34 y.o.   MRN: [id number]  HPI  Body aches starting around 2013, after Roux en Y procedure, then had a corrective Roux en Y done in 2014 - weight stabilized and she was eating normally. Gets the routine labs done by gastric surgeon every February, 2016 were normal per the patient.  Hurts all over, described as mostly located to the back of shoulder, lower back, reports restless legs, feeling like "nerves don't stop firing, burning sensation." Radiates "everywhere." She is often very tired and has difficulty maintaining her energy on work  Spells of low appetite, no abdominal pain, hx ulcers in small intestine, takes Protonix as needed, seeing GI, not sure name of doctor, has signed records release.   Recently seen at [facility] medicine for blood-work, put on iron pills.   Surgeries: Gallbladder, D&C, Roux en Y done twice. Other past and social history reviewed and not pertinent. Family history nonpertinent  Past Medical History  Diagnosis Date  . Multiple gastric ulcers    Past Surgical History  Procedure Laterality Date  . Gastric bypass    . Cholecystectomy    . Dilation and curettage of uterus    . Gallbladder surgery  2012     Filed Vitals:   09/29/14 1501  BP: 102/64  Pulse: 96    C Review of Systems  Constitutional: Negative for fever, chills, fatigue and unexpected weight change.  HENT:       No difficulty hearing/ringing in ears. No hay fever/allergies  Eyes: Negative for visual disturbance.       No changes in vision  Respiratory: Negative for cough and wheezing.   Cardiovascular: Negative for chest pain and palpitations.  Gastrointestinal: Negative for nausea, vomiting, diarrhea and blood in stool.  Genitourinary: Negative for enuresis.       No incontinence. No unusual genital discharge/bleeding  Musculoskeletal: Negative for myalgias and arthralgias.  Skin: Negative for rash.        No new or changed moles.  Neurological: Negative for dizziness, weakness and headaches.       No memory problems.   Hematological: Negative for adenopathy. Does not bruise/bleed easily.  Psychiatric/Behavioral: Positive for sleep disturbance and decreased concentration. Negative for suicidal ideas and self-injury. The patient is nervous/anxious.           Objective:   Physical Exam  Constitutional: She is oriented to person, place, and time. She appears well-developed and well-nourished. No distress.  HENT:  Head: Normocephalic and atraumatic.  Eyes: Conjunctivae are normal. Right eye exhibits no discharge. Left eye exhibits no discharge. No scleral icterus.  Neck: Normal range of motion. No thyromegaly present.  Cardiovascular: Normal rate, regular rhythm and normal heart sounds.   No murmur heard. Pulmonary/Chest: Effort normal and breath sounds normal. No respiratory distress.  Abdominal: Soft. Bowel sounds are normal. There is no tenderness.  Musculoskeletal: Normal range of motion.  Neurological: She is alert and oriented to person, place, and time. No cranial nerve deficit.  Skin: Skin is warm and dry.  Psychiatric: She has a normal mood and affect. Her behavior is normal. Judgment and thought content normal.          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 Patient advised on side effects of this medication, take first at night until you know how it affects her, can increase dose if it is helping somewhat but not reaching goals.  Undertreated depression is also a consideration, may consider change/increase in her antidepressant medication.   Patient has long-standing history of GI complications including ulcers and multiple Roux-en-Y gastric bypass surgeries. We'll repeat repeats annual lab work for Roux-en-Y, malnutrition may be contributing to her symptoms, she has no GI concerns at this time.  Fatigue, we will also check TSH. Due for HIV testing. If all lab work is negative may consider diagnosis of fibromyalgia, versus workup for possible autoimmune disorder though this is unlikely given her symptoms  Preventive care reviewed as below.   FEMALE PREVENTIVE CARE  ANNUAL SCREENING/COUNSELING Tobacco - NEVER Alcohol - OCCASIONAL/SOCIAL Diet/Exercise - WALKING OCCASIONALLY Sexual Health/STI - NO CONCERNS, DECLINES STI TESTING Depression - TAKES LEXAPRO, HELPS MOST DAYS Domestic violence - NO CONCERNS HTN - SEE VITALS Vaccination status - UTD PER PATIENT  INFECTIOUS DISEASE SCREENING HIV - all adults 15-65 - TESTED ANNUALLY, NEGATIVE GC/CT - sexually active -  NEGATIVE LAST YEAR HepC - born 68-1964 -   DISEASE SCREENING Lipid - age 61+ if risk - NORMAL 07/08/2014 PER PATIENT DM2 - overweight age 17-70 or other risk factors - NORMAL PER PATIENT Osteoporosis - age 33+ or one sooner if risk - NO NEED  CANCER SCREENING Cervical - Pap q3 yr age 3+, Pap + HPV q5y age 29+ - NORMAL 12/2013 Breast - Mammo age 46+ (C) and biennial age 47-75 (A) - NO NEED Lung - low dose CT Chest age 22-80 - NO NEED Colon - age 51+ or 33 years of age prior to FH Dx - NO NEED  VACCINATION Influenza - annual - GETS HPV - age <19yo - NO NEED Zoster - age 41+ - NO NEED Pneumonia - age 33+ sooner if risk (DM, other) - NO NEED =

## 2014-09-29 NOTE — Patient Instructions (Signed)
Muscle Pain Muscle pain (myalgia) may be caused by many things, including:  Overuse or muscle strain, especially if you are not in shape. This is the most common cause of muscle pain.  Injury.  Bruises.  Viruses, such as the flu.  Infectious diseases.  Fibromyalgia, which is a chronic condition that causes muscle tenderness and fatigue.  Autoimmune diseases, including lupus.  Certain drugs, including ACE inhibitors and statins. Muscle pain may be mild or severe. In most cases, the pain lasts only a short time and goes away without treatment. To diagnose the cause of your muscle pain, your health care provider will take your medical history. This means he or she will ask you when your muscle pain began and what has been happening. If you have not had muscle pain for very long, your health care provider may want to wait before doing much testing. If your muscle pain has lasted a long time, your health care provider may want to run tests right away. If your health care provider thinks your muscle pain may be caused by illness, you may need to have additional tests to rule out certain conditions.  Treatment for muscle pain depends on the cause. Home care is often enough to relieve muscle pain. Your health care provider may also prescribe anti-inflammatory medicine. HOME CARE INSTRUCTIONS Watch your condition for any changes. The following actions may help to lessen any discomfort you are feeling:  Only take over-the-counter or prescription medicines as directed by your health care provider.  Apply ice to the sore muscle:  Put ice in a plastic bag.  Place a towel between your skin and the bag.  Leave the ice on for 15-20 minutes, 3-4 times a day.  You may alternate applying hot and cold packs to the muscle as directed by your health care provider.  If overuse is causing your muscle pain, slow down your activities until the pain goes away.  Remember that it is normal to feel some  muscle pain after starting a workout program. Muscles that have not been used often will be sore at first.  Do regular, gentle exercises if you are not usually active.  Warm up before exercising to lower your risk of muscle pain.  Do not continue working out if the pain is very bad. Bad pain could mean you have injured a muscle. SEEK MEDICAL CARE IF:  Your muscle pain gets worse, and medicines do not help.  You have muscle pain that lasts longer than 3 days.  You have a rash or fever along with muscle pain.  You have muscle pain after a tick bite.  You have muscle pain while working out, even though you are in good physical condition.  You have redness, soreness, or swelling along with muscle pain.  You have muscle pain after starting a new medicine or changing the dose of a medicine. SEEK IMMEDIATE MEDICAL CARE IF:  You have trouble breathing.  You have trouble swallowing.  You have muscle pain along with a stiff neck, fever, and vomiting.  You have severe muscle weakness or cannot move part of your body. MAKE SURE YOU:   Understand these instructions.  Will watch your condition.  Will get help right away if you are not doing well or get worse. Document Released: 12/27/2005 Document Revised: 02/09/2013 Document Reviewed: 12/01/2012 [facility] Patient Information 2015 [place], [place]. This information is not intended to replace advice given to you by your health care provider. Make sure you discuss any  questions you have with your health care provider.  Restless Legs Syndrome Restless legs syndrome is a movement disorder. It may also be called a sensorimotor disorder.  CAUSES  No one knows what specifically causes restless legs syndrome, but it tends to run in families. It is also more common in people with low iron, in pregnancy, in people who need dialysis, and those with nerve damage (neuropathy).Some medications may make restless legs syndrome worse.Those medications  include drugs to treat high blood pressure, some heart conditions, nausea, colds, allergies, and depression. SYMPTOMS Symptoms include uncomfortable sensations in the legs. These leg sensations are worse during periods of inactivity or rest. They are also worse while sitting or lying down. Individuals that have the disorder describe sensations in the legs that feel like:  Pulling.  Drawing.  Crawling.  Worming.  Boring.  Tingling.  Pins and needles.  Prickling.  Pain. The sensations are usually accompanied by an overwhelming urge to move the legs. Sudden muscle jerks may also occur. Movement provides temporary relief from the discomfort. In rare cases, the arms may also be affected. Symptoms may interfere with going to sleep (sleep onset insomnia). Restless legs syndrome may also be related to periodic limb movement disorder (PLMD). PLMD is another more common motor disorder. It also causes interrupted sleep. The symptoms from PLMD usually occur most often when you are awake. TREATMENT  Treatment for restless legs syndrome is symptomatic. This means that the symptoms are treated.   Massage and cold compresses may provide temporary relief.  Walk, stretch, or take a cold or hot bath.  Get regular exercise and a good night's sleep.  Avoid caffeine, alcohol, nicotine, and medications that can make it worse.  Do activities that provide mental stimulation like discussions, needlework, and video games. These may be helpful if you are not able to walk or stretch. Some medications are effective in relieving the symptoms. However, many of these medications have side effects. Ask your caregiver about medications that may help your symptoms. Correcting iron deficiency may improve symptoms for some patients. Document Released: 01/25/2002 Document Revised: 06/21/2013 Document Reviewed: 05/03/2010 [facility] Patient Information 2015 [place], [place]. This information is not intended to replace  advice given to you by your health care provider. Make sure you discuss any questions you have with your health care provider.

## 2014-09-30 LAB — URINALYSIS
Bilirubin Urine: NEGATIVE
Glucose, UA: NEGATIVE
HGB URINE DIPSTICK: NEGATIVE
KETONES UR: NEGATIVE
Leukocytes, UA: NEGATIVE
NITRITE: NEGATIVE
Protein, ur: NEGATIVE
Specific Gravity, Urine: 1.018 (ref 1.001–1.035)
pH: 7 (ref 5.0–8.0)

## 2014-09-30 LAB — COMPREHENSIVE METABOLIC PANEL
ALT: 17 U/L (ref 6–29)
AST: 25 U/L (ref 10–30)
Albumin: 3.9 g/dL (ref 3.6–5.1)
Alkaline Phosphatase: 58 U/L (ref 33–115)
BILIRUBIN TOTAL: 0.3 mg/dL (ref 0.2–1.2)
BUN: 9 mg/dL (ref 7–25)
CALCIUM: 8.9 mg/dL (ref 8.6–10.2)
CHLORIDE: 102 mmol/L (ref 98–110)
CO2: 26 mmol/L (ref 20–31)
Creat: 0.84 mg/dL (ref 0.50–1.10)
Glucose, Bld: 76 mg/dL (ref 65–99)
Potassium: 3.9 mmol/L (ref 3.5–5.3)
Sodium: 140 mmol/L (ref 135–146)
TOTAL PROTEIN: 6.4 g/dL (ref 6.1–8.1)

## 2014-09-30 LAB — FERRITIN: FERRITIN: 7 ng/mL — AB (ref 10–291)

## 2014-09-30 LAB — TSH: TSH: 4.036 u[IU]/mL (ref 0.350–4.500)

## 2014-09-30 LAB — VITAMIN B12: Vitamin B-12: 627 pg/mL (ref 211–911)

## 2014-09-30 LAB — VITAMIN D 25 HYDROXY (VIT D DEFICIENCY, FRACTURES): Vit D, 25-Hydroxy: 25 ng/mL — ABNORMAL LOW (ref 30–100)

## 2014-09-30 LAB — HIV ANTIBODY (ROUTINE TESTING W REFLEX): HIV: NONREACTIVE

## 2014-09-30 LAB — MAGNESIUM: Magnesium: 2 mg/dL (ref 1.5–2.5)

## 2014-10-01 LAB — ZINC: ZINC: 66 ug/dL (ref 60–130)

## 2014-10-02 LAB — VITAMIN B6: Vitamin B6: 12.1 ng/mL (ref 2.1–21.7)

## 2014-10-03 LAB — CERULOPLASMIN: Ceruloplasmin: 26 mg/dL (ref 18–53)

## 2014-10-03 LAB — VITAMIN A: VITAMIN A (RETINOIC ACID): 45 ug/dL (ref 38–98)

## 2014-10-04 ENCOUNTER — Encounter: Payer: Self-pay | Admitting: Osteopathic Medicine

## 2014-10-04 DIAGNOSIS — B379 Candidiasis, unspecified: Secondary | ICD-10-CM

## 2014-10-04 LAB — VITAMIN B1: Vitamin B1 (Thiamine): 9 nmol/L (ref 8–30)

## 2014-10-04 MED ORDER — FLUCONAZOLE 150 MG PO TABS: 150.0000 mg | ORAL_TABLET | Freq: Once | ORAL | Status: DC

## 2014-10-04 NOTE — Telephone Encounter (Signed)
Call patient: Happy to write for Diclucan, this should be at her pharmacy. I do not prescribe percocet or similar opiate medications for chronic pain - she can try going up on gabapentin as instructed in clinic otherwise she needs to come back for discussion of this issue of pain management.

## 2014-10-05 ENCOUNTER — Telehealth: Payer: Self-pay | Admitting: Osteopathic Medicine

## 2014-10-05 NOTE — Telephone Encounter (Signed)
Pt husband advised there will be no narcotic prescribed. Advised Pt could come in to discuss alternatives or a referral could be placed to pain management. Husband states a referral would not be necessary but when his wife wakes up he will discuss her coming in for a f/u appt. No further questions.

## 2014-10-05 NOTE — Telephone Encounter (Signed)
Please call patient: I do not routinely prescribe opiates, and I absolutely do not prescribe these medications for chronic pain. She is welcome to schedule an office visit to discuss alternatives or I can send a referral to pain management. If patient would like to see a specific specialist they need to provide the name of the doctor/clinic and I am happy to place referral. Thanks.

## 2014-10-05 NOTE — Telephone Encounter (Signed)
Pt's husband called to see if PCP was going to write an Rx for "Percocet 7.5/325mg  q8hr." States they were waiting on lab results before the Rx would be written. Offered to go over lab results on the phone with husband but he denied stating "my wife saw them on her mychart and is a nurse so has no questions." Verbalized understanding. Advised I would route this request for review then contact him back regarding outcome. No further questions.

## 2014-10-07 ENCOUNTER — Telehealth: Payer: Self-pay | Admitting: Osteopathic Medicine

## 2014-10-07 NOTE — Telephone Encounter (Signed)
Victorino Dike, I reviewed her chart and don't see any strong reason for this switch.  I would recommend she continue to see Dr. Lyn Hollingshead.  I'm happy to provide any acute care though, this is not fair to new patient's requesting to see me that are booked out for weeks to months.  Please cancel her appointment with me.

## 2014-10-07 NOTE — Telephone Encounter (Signed)
I left a message for patient letting her know that her next appt is with Dr.Alexander since that is who she established with and changed her her next appt time with Dr.Alexander. Thanks

## 2014-10-07 NOTE — Telephone Encounter (Signed)
Pt seen Dr.Alexander 1x as a new patient , but her friend sees Dr.Hommel so she ask to switc her pcp to Dr.Hommel, so I made her appt with Dr.Hommel on Monday 10/10/14. Let me know if this is a problem

## 2014-10-10 ENCOUNTER — Encounter: Payer: Self-pay | Admitting: Osteopathic Medicine

## 2014-10-10 ENCOUNTER — Ambulatory Visit (INDEPENDENT_AMBULATORY_CARE_PROVIDER_SITE_OTHER): Payer: BLUE CROSS/BLUE SHIELD | Admitting: Osteopathic Medicine

## 2014-10-10 VITALS — BP 108/75 | HR 81 | Wt 142.0 lb

## 2014-10-10 DIAGNOSIS — M797 Fibromyalgia: Secondary | ICD-10-CM | POA: Diagnosis not present

## 2014-10-10 DIAGNOSIS — R5383 Other fatigue: Secondary | ICD-10-CM | POA: Diagnosis not present

## 2014-10-10 DIAGNOSIS — R52 Pain, unspecified: Secondary | ICD-10-CM | POA: Diagnosis not present

## 2014-10-10 MED ORDER — PREGABALIN 75 MG PO CAPS
75.0000 mg | ORAL_CAPSULE | Freq: Two times a day (BID) | ORAL | Status: DC
Start: 2014-10-10 — End: 2014-11-03

## 2014-10-10 NOTE — Patient Instructions (Signed)
Fibromyalgia Fibromyalgia is a disorder that is often misunderstood. It is associated with muscular pains and tenderness that comes and goes. It is often associated with fatigue and sleep disturbances. Though it tends to be long-lasting, fibromyalgia is not life-threatening. CAUSES  The exact cause of fibromyalgia is unknown. People with certain gene types are predisposed to developing fibromyalgia and other conditions. Certain factors can play a role as triggers, such as:  Spine disorders.  Arthritis.  Severe injury (trauma) and other physical stressors.  Emotional stressors. SYMPTOMS   The main symptom is pain and stiffness in the muscles and joints, which can vary over time.  Sleep and fatigue problems. Other related symptoms may include:  Bowel and bladder problems.  Headaches.  Visual problems.  Problems with odors and noises.  Depression or mood changes.  Painful periods (dysmenorrhea).  Dryness of the skin or eyes. DIAGNOSIS  There are no specific tests for diagnosing fibromyalgia. Patients can be diagnosed accurately from the specific symptoms they have. The diagnosis is made by determining that nothing else is causing the problems. TREATMENT  There is no cure. Management includes medicines and an active, healthy lifestyle. The goal is to enhance physical fitness, decrease pain, and improve sleep.  HOME CARE INSTRUCTIONS   Only take over-the-counter or prescription medicines as directed by your caregiver. Sleeping pills, tranquilizers, and pain medicines may make your problems worse.  Low-impact aerobic exercise is very important and advised for treatment. At first, it may seem to make pain worse. Gradually increasing your tolerance will overcome this feeling.  Learning relaxation techniques and how to control stress will help you. Biofeedback, visual imagery, hypnosis, muscle relaxation, yoga, and meditation are all options.  Anti-inflammatory medicines and  physical therapy may provide short-term help.  Acupuncture or massage treatments may help.  Take medicines as suggested by your caregiver.  Avoid stressful situations.  Plan a healthy lifestyle. This includes your diet, sleep, rest, exercise, and friends.  Find and practice a hobby you enjoy.  Join a fibromyalgia support group for interaction, ideas, and sharing advice. This may be helpful.   SEEK MEDICAL CARE IF:  You are not having good results or improvement from your treatment. FOR MORE INFORMATION  National Fibromyalgia Association: www.fmaware.org Arthritis Foundation: www.arthritis.org Document Released: 02/04/2005 Document Revised: 04/29/2011 Document Reviewed: 05/17/2009 [facility] Patient Information 2015 [place], [place]. This information is not intended to replace advice given to you by your health care provider. Make sure you discuss any questions you have with your health care provider.

## 2014-10-10 NOTE — Progress Notes (Signed)
HPI: Cheyenne Murillo is a 34 y.o. female who presents to [facility]  today for pain followup. Labs reviewed - patient hsa no concerns. There has been some poor communication between our office and her husband wth regard to request for percocet - I clarified with the patient that opiates are not appropriate in my opinion, she voices understanding and is amenable to other options, just wants something for the pain. Still having achiness and difficulty sleeping, chronic >3 mos, gabapentin has not really made much difference.   Past medical, social and family history reviewed: Past Medical History  Diagnosis Date  . Multiple gastric ulcers    Past Surgical History  Procedure Laterality Date  . Gastric bypass    . Cholecystectomy    . Dilation and curettage of uterus    . Gallbladder surgery  2012   Social History  Substance Use Topics  . Smoking status: Never Smoker   . Smokeless tobacco: Not on file  . Alcohol Use: No   Family History  Problem Relation Age of Onset  . Depression Mother   . Pancreatitis Mother   . Hypertension Mother   . Hyperlipidemia Mother   . Depression Father   . Hypertension Father   . Hyperlipidemia Father   . Hypertension Sister   . Depression Brother   . Suicidality Brother     Current Outpatient Prescriptions  Medication Sig Dispense Refill  . dicyclomine (BENTYL) 20 MG tablet Take 1 tablet (20 mg total) by mouth 2 (two) times daily. 20 tablet 0  . escitalopram (LEXAPRO) 10 MG tablet Take 10 mg by mouth daily.    . fluconazole (DIFLUCAN) 150 MG tablet Take 1 tablet (150 mg total) by mouth once. 1 tablet 0  . Iron Polysacch Cmplx-B12-FA 150-0.025-1 MG CAPS Take by mouth.    . ondansetron (ZOFRAN) 4 MG tablet Take 1 tablet (4 mg total) by mouth every 6 (six) hours. 12 tablet 0  . pantoprazole (PROTONIX) 40 MG tablet Take 40 mg by mouth daily.    . Prenatal Vit-Fe Fumarate-FA (PRENATAL MULTIVITAMIN) TABS tablet  Take 1 tablet by mouth daily at 12 noon.    . promethazine (PHENERGAN) 12.5 MG tablet Take 12.5 mg by mouth as needed for nausea or vomiting.    . sucralfate (CARAFATE) 1 G tablet Take 1 tablet (1 g total) by mouth 4 (four) times daily. 30 tablet 0  .    0   No current facility-administered medications for this visit.   No Active Allergies   Review of Systems: CONSTITUTIONAL: Neg fever/chills, no unintentional weight changes HEAD/EYES/EARS/NOSE: No headache/vision change or hearing change CARDIAC: No chest pain/pressure/palpitations, no orthopnea RESPIRATORY: No cough/shortness of breath/wheeze GASTROINTESTINAL: No nausea/vomiting/abdominal pain/blood in stool/diarrhea/constipation MUSCULOSKELETAL: (+) myalgia/arthralgia GENITOURINARY: No incontinence, No abnormal genital bleeding/discharge SKIN: No rash/wounds/concerning lesions PSYCHIATRIC: Depression controlled on Lexapro, denies problems with anxiety, reports sleep probems   Exam:  BP 108/75 mmHg  Pulse 81  Wt 142 lb (64.411 kg)  SpO2 100%  LMP 08/18/2014 Constitutional: VSS, see above. General Appearance: alert, well-developed, well-nourished, NAD Psychiatric: Normal judgment/insight. Normal mood and affect. Oriented x3.    No results found for this or any previous visit (from the past 72 hour(s)). No results found.   ASSESSMENT/PLAN:  1. Fibromyalgia syndrome (+) diagnosis by [employer] College Rheumat[employer] diagnositc criteria - pregabalin (LYRICA) 75 MG capsule; Take 1 capsule (75 mg total) by mouth 2 (two) times daily. For one week, then increase to 2 capsules (  150 mg total) by mouth 2 (two) times daily.  Dispense: 60 capsule; Refill: 0  - Pt instructed to not exceed $RemoveBe'150mg'rhWFxCqDR$  bid without followup in office. Patient has been educated on significant possible side effects of medication and is instructed to contact me or other medical professional with any concerns about side effects. Pt information printed and reviewed with  her, particularly with regard to exercise regimen as part of treatment plan - advised start walking daily as tolerated.     2. Other fatigue, Body aches -  Labs to r/o inflammatory process  - Sed Rate (ESR) - C-reactive protein - Antinuclear Antib (ANA) - Rheumatoid Factor - CK (Creatine Kinase)    Greater than 50% visit spent counselling on lab results, diagnosis, treatment.

## 2014-10-11 ENCOUNTER — Other Ambulatory Visit: Payer: Self-pay | Admitting: Osteopathic Medicine

## 2014-10-11 DIAGNOSIS — D649 Anemia, unspecified: Secondary | ICD-10-CM

## 2014-10-11 DIAGNOSIS — R5383 Other fatigue: Secondary | ICD-10-CM

## 2014-10-11 LAB — SEDIMENTATION RATE: SED RATE: 7 mm/h (ref 0–20)

## 2014-10-11 LAB — C-REACTIVE PROTEIN: CRP: <0.5 mg/dL (ref <0.60)

## 2014-10-11 LAB — RHEUMATOID FACTOR

## 2014-10-11 LAB — ANA: Anti Nuclear Antibody(ANA): NEGATIVE

## 2014-10-11 LAB — CK: CK TOTAL: 48 U/L (ref 7–177)

## 2014-10-18 ENCOUNTER — Encounter: Payer: Self-pay | Admitting: Osteopathic Medicine

## 2014-10-18 DIAGNOSIS — M797 Fibromyalgia: Secondary | ICD-10-CM

## 2014-10-20 MED ORDER — GABAPENTIN 600 MG PO TABS: 600.0000 mg | ORAL_TABLET | Freq: Three times a day (TID) | ORAL | Status: DC

## 2014-10-20 NOTE — Telephone Encounter (Signed)
We can increase the Gabapentin. To use up the pills you have, I would start at 2-3 of the 300 mg pills at a time and see if it helps, can go up to 4 pills three times per day MAXIMUM of 1,200 mg three times per day.  I will call in refills of higher-dose pills, please remember to not exceed maximum 3600 mg per 24 hours.  I will send referral to pain clinic.  I have not heard anything form your insurance yet about the Lyrica.  Please remember that walking will actually HELP the fibromyalgia pain.

## 2014-10-21 LAB — CBC WITH DIFFERENTIAL/PLATELET
Basophils Absolute: 0 10*3/uL (ref 0.0–0.1)
Basophils Relative: 0 % (ref 0–1)
Eosinophils Absolute: 0.2 10*3/uL (ref 0.0–0.7)
Eosinophils Relative: 3 % (ref 0–5)
HEMATOCRIT: 30.5 % — AB (ref 36.0–46.0)
HEMOGLOBIN: 10.1 g/dL — AB (ref 12.0–15.0)
LYMPHS ABS: 2.6 10*3/uL (ref 0.7–4.0)
Lymphocytes Relative: 38 % (ref 12–46)
MCH: 29.5 pg (ref 26.0–34.0)
MCHC: 33.1 g/dL (ref 30.0–36.0)
MCV: 89.2 fL (ref 78.0–100.0)
MONO ABS: 0.5 10*3/uL (ref 0.1–1.0)
MONOS PCT: 7 % (ref 3–12)
MPV: 9.7 fL (ref 8.6–12.4)
NEUTROS ABS: 3.5 10*3/uL (ref 1.7–7.7)
NEUTROS PCT: 52 % (ref 43–77)
Platelets: 271 10*3/uL (ref 150–400)
RBC: 3.42 MIL/uL — ABNORMAL LOW (ref 3.87–5.11)
RDW: 15.5 % (ref 11.5–15.5)
WBC: 6.8 10*3/uL (ref 4.0–10.5)

## 2014-10-26 NOTE — Telephone Encounter (Signed)
Per our previous communication, you stated you did not need to go to the pain clinic. Please let me know if there is a particular doctor/clinic you would like to see, otherwise we refer to the first available provider.

## 2014-10-26 NOTE — Addendum Note (Signed)
Addended by: Deirdre Pippins on: 10/26/2014 04:50 PM   Modules accepted: Orders

## 2014-10-26 NOTE — Addendum Note (Signed)
Addended by: Deirdre Pippins on: 10/26/2014 04:51 PM   Modules accepted: Orders

## 2014-10-27 ENCOUNTER — Ambulatory Visit (INDEPENDENT_AMBULATORY_CARE_PROVIDER_SITE_OTHER): Payer: BLUE CROSS/BLUE SHIELD | Admitting: Osteopathic Medicine

## 2014-10-27 ENCOUNTER — Encounter: Payer: Self-pay | Admitting: Osteopathic Medicine

## 2014-10-27 VITALS — BP 108/69 | HR 81 | Wt 150.0 lb

## 2014-10-27 DIAGNOSIS — D508 Other iron deficiency anemias: Secondary | ICD-10-CM

## 2014-10-27 DIAGNOSIS — F329 Major depressive disorder, single episode, unspecified: Secondary | ICD-10-CM

## 2014-10-27 DIAGNOSIS — F418 Other specified anxiety disorders: Secondary | ICD-10-CM | POA: Diagnosis not present

## 2014-10-27 DIAGNOSIS — Z9884 Bariatric surgery status: Secondary | ICD-10-CM | POA: Diagnosis not present

## 2014-10-27 DIAGNOSIS — F419 Anxiety disorder, unspecified: Secondary | ICD-10-CM

## 2014-10-27 DIAGNOSIS — M797 Fibromyalgia: Secondary | ICD-10-CM | POA: Diagnosis not present

## 2014-10-27 DIAGNOSIS — F32A Depression, unspecified: Secondary | ICD-10-CM

## 2014-10-27 LAB — RETICULOCYTES
ABS Retic: 55.7 10*3/uL (ref 19.0–186.0)
RBC.: 3.48 MIL/uL — AB (ref 3.87–5.11)
RETIC CT PCT: 1.6 % (ref 0.4–2.3)

## 2014-10-27 LAB — FERRITIN: Ferritin: 6 ng/mL — ABNORMAL LOW (ref 10–291)

## 2014-10-27 NOTE — Progress Notes (Signed)
HPI: Cheyenne Murillo is a 34 y.o. female who presents to [facility]  today for chief complaint of: discuss labs  Recently diagnosed this patient with fibromyalgia and American College of rheumatology criteria. We were working  on transitioning patient from gabapentin to Lyrica, but having issues with shortness. patient initially did not want referral to pain management however recent message said that she did and this is currently in progress. want referral - the patient advice request/email for details. Reports increased mood swings, not sure if Lexapro not working is the issue or if her pain is contributing to these symptoms   Recent labs (+) anemia, Hgb 10.1 down from 12.1 4 months ago, initially a CBC was not collected due to orders error. Pt has also been complaining of fatigue. She is taking iron pills as below.    Past medical, social and family history reviewed: Past Medical History  Diagnosis Date  . Multiple gastric ulcers   . Primary fibromyalgia syndrome   . Anemia    Past Surgical History  Procedure Laterality Date  . Gastric bypass    . Cholecystectomy    . Dilation and curettage of uterus    . Gallbladder surgery  2012   Social History  Substance Use Topics  . Smoking status: Never Smoker   . Smokeless tobacco: Not on file  . Alcohol Use: No   Family History  Problem Relation Age of Onset  . Depression Mother   . Pancreatitis Mother   . Hypertension Mother   . Hyperlipidemia Mother   . Depression Father   . Hypertension Father   . Hyperlipidemia Father   . Hypertension Sister   . Depression Brother   . Suicidality Brother     Current Outpatient Prescriptions  Medication Sig Dispense Refill  . dicyclomine (BENTYL) 20 MG tablet Take 1 tablet (20 mg total) by mouth 2 (two) times daily. 20 tablet 0  . escitalopram (LEXAPRO) 10 MG tablet Take 10 mg by mouth daily.    . fluconazole (DIFLUCAN) 150 MG tablet Take 1 tablet  (150 mg total) by mouth once. 1 tablet 0  . gabapentin (NEURONTIN) 300 MG capsule - USE UP THEN TRNAISITON TO 600MG  AS BELOW Take 300 mg by mouth 3 (three) times daily as needed. For pain    . gabapentin (NEURONTIN) 600 MG tablet TO STOP WHEN LYRICA AVAILABLE TO START Take 1-2 tablets (600-1,200 mg total) by mouth 3 (three) times daily. DO NOT TAKE MORE THAN 6 PILLS (3600 MG) IN 24 HOURS 120 tablet 1  . Iron Polysacch Cmplx-B12-FA 150-0.025-1 MG CAPS Take by mouth.    . ondansetron (ZOFRAN) 4 MG tablet Take 1 tablet (4 mg total) by mouth every 6 (six) hours. 12 tablet 0  . pantoprazole (PROTONIX) 40 MG tablet Take 40 mg by mouth daily.    . pregabalin (LYRICA) 75 MG capsule Take 1 capsule (75 mg total) by mouth 2 (two) times daily. For one week, then increase to 2 capsules (150 mg total) by mouth 2 (two) times daily. 60 capsule 0  . Prenatal Vit-Fe Fumarate-FA (PRENATAL MULTIVITAMIN) TABS tablet Take 1 tablet by mouth daily at 12 noon.    . promethazine (PHENERGAN) 12.5 MG tablet Take 12.5 mg by mouth as needed for nausea or vomiting.    . sucralfate (CARAFATE) 1 G tablet Take 1 tablet (1 g total) by mouth 4 (four) times daily. 30 tablet 0   No current facility-administered medications for this visit.  No Active Allergies   Review of Systems: CONSTITUTIONAL: Neg fever/chills, no unintentional weight changes, (+) FATIGUE CARDIAC: No chest pain/pressure/palpitations, no orthopnea RESPIRATORY: No cough/shortness of breath/wheeze GASTROINTESTINAL: No nausea/vomiting/abdominal pain/blood in stool/diarrhea/constipation MUSCULOSKELETAL: (+) DIFFUSE AND CHRONIC myalgia/arthralgia SKIN: No rash/wounds/concerning lesions HEM/ONC: No easy bruising/bleeding, no abnormal lymph node, no heavy periods  PSYCH: Reports mood swings, no SI/HI, thinks lexapro mya notbe working as well but may also be due to pain   Exam:  BP 108/69 mmHg  Pulse 81  Wt 150 lb (68.04 kg)  SpO2 95%  LMP  08/18/2014 Constitutional: VSS, see above. General Appearance: alert, well-developed, well-nourished, NAD Musculoskeletal: Gait normal. No clubbing/cyanosis of digits.  Neurological: Motor and sensation intact and symmetric Psychiatric: Normal judgment/insight. Depressed mood and affect. Oriented x3.    No results found for this or any previous visit (from the past 72 hour(s)).    ASSESSMENT/PLAN:  Other iron deficiency anemias - consider adjusting dose of iron supplement, patient was given information on nutritional sources of iron, labs pending  History of Roux-en-Y gastric bypass - Likely contributing to nutritional deficiency  Fibromyalgia syndrome - Look into obtaining prior authorization for Lyrica, pain management referral is in progress  Anxiety and depression - Will see how Lyrica works if pain control helps symptoms, may consider adjust/switch Lexapro

## 2014-10-28 ENCOUNTER — Telehealth: Payer: Self-pay | Admitting: Osteopathic Medicine

## 2014-10-28 LAB — TRANSFERRIN: Transferrin: 311 mg/dL (ref 188–341)

## 2014-10-28 LAB — IRON AND TIBC
%SAT: 5 % — AB (ref 11–50)
Iron: 20 ug/dL — ABNORMAL LOW (ref 40–190)
TIBC: 412 ug/dL (ref 250–450)
UIBC: 392 ug/dL (ref 125–400)

## 2014-10-28 MED ORDER — FERROUS SULFATE 325 (65 FE) MG PO TABS: 325.0000 mg | ORAL_TABLET | Freq: Every day | ORAL | Status: DC

## 2014-10-28 NOTE — Telephone Encounter (Signed)
Received fax for prior authorization on Lyrica 75 filled out form and faxed to [facility] waiting on authorization. - CF

## 2014-10-28 NOTE — Addendum Note (Signed)
Addended by: Deirdre Pippins on: 10/28/2014 08:12 AM   Modules accepted: Orders

## 2014-11-03 ENCOUNTER — Other Ambulatory Visit: Payer: Self-pay | Admitting: Osteopathic Medicine

## 2014-11-03 ENCOUNTER — Encounter: Payer: Self-pay | Admitting: Osteopathic Medicine

## 2014-11-03 DIAGNOSIS — M797 Fibromyalgia: Secondary | ICD-10-CM

## 2014-11-03 MED ORDER — PREGABALIN 150 MG PO CAPS: 150.0000 mg | ORAL_CAPSULE | Freq: Two times a day (BID) | ORAL | Status: AC

## 2014-11-04 ENCOUNTER — Other Ambulatory Visit: Payer: Self-pay | Admitting: Osteopathic Medicine

## 2014-11-04 MED ORDER — CYCLOBENZAPRINE HCL 10 MG PO TABS: 10.0000 mg | ORAL_TABLET | Freq: Three times a day (TID) | ORAL | Status: DC | PRN

## 2014-11-04 NOTE — Progress Notes (Signed)
Left message advising patient that the PA is still pending. We are waiting on the insurance company to make a decision.

## 2014-11-04 NOTE — Progress Notes (Signed)
Noted  

## 2014-11-08 NOTE — Telephone Encounter (Signed)
Received fax re: denial of Lyrica for Fibromyalgia pain.  She has been on three formulary medications without meaningful pain relief (Escitalopram = SSRI, Gabapentin, and I recently sent in Cyclobenzaprine). If Cyclobenzaprine/Flexeril is helping, we could probably continue that instead of lyrica, please ask patient if this plan is something she is willing to try otherwise will see if pain management can offer any other helpful suggestions for dealing with this problem. Thanks

## 2014-11-10 NOTE — Telephone Encounter (Signed)
Left message advising of recommendations and for a return call.  

## 2014-11-14 ENCOUNTER — Encounter (HOSPITAL_COMMUNITY): Payer: Self-pay

## 2014-11-14 ENCOUNTER — Emergency Department (HOSPITAL_COMMUNITY)
Admission: EM | Admit: 2014-11-14 | Discharge: 2014-11-14 | Disposition: A | Discharge status: Home / Self Care | Payer: BLUE CROSS/BLUE SHIELD | Attending: Emergency Medicine | Admitting: Emergency Medicine

## 2014-11-14 ENCOUNTER — Emergency Department (HOSPITAL_COMMUNITY): Payer: BLUE CROSS/BLUE SHIELD

## 2014-11-14 DIAGNOSIS — R1030 Lower abdominal pain, unspecified: Secondary | ICD-10-CM | POA: Insufficient documentation

## 2014-11-14 DIAGNOSIS — Z3202 Encounter for pregnancy test, result negative: Secondary | ICD-10-CM | POA: Diagnosis not present

## 2014-11-14 DIAGNOSIS — R55 Syncope and collapse: Secondary | ICD-10-CM | POA: Insufficient documentation

## 2014-11-14 DIAGNOSIS — R1110 Vomiting, unspecified: Secondary | ICD-10-CM | POA: Insufficient documentation

## 2014-11-14 DIAGNOSIS — Z8719 Personal history of other diseases of the digestive system: Secondary | ICD-10-CM | POA: Insufficient documentation

## 2014-11-14 DIAGNOSIS — Z79899 Other long term (current) drug therapy: Secondary | ICD-10-CM | POA: Insufficient documentation

## 2014-11-14 DIAGNOSIS — D649 Anemia, unspecified: Secondary | ICD-10-CM | POA: Insufficient documentation

## 2014-11-14 DIAGNOSIS — R109 Unspecified abdominal pain: Secondary | ICD-10-CM

## 2014-11-14 DIAGNOSIS — Z8739 Personal history of other diseases of the musculoskeletal system and connective tissue: Secondary | ICD-10-CM | POA: Insufficient documentation

## 2014-11-14 LAB — BASIC METABOLIC PANEL
ANION GAP: 7 (ref 5–15)
BUN: 11 mg/dL (ref 6–20)
CHLORIDE: 101 mmol/L (ref 101–111)
CO2: 29 mmol/L (ref 22–32)
Calcium: 9.1 mg/dL (ref 8.9–10.3)
Creatinine, Ser: 0.93 mg/dL (ref 0.44–1.00)
GFR calc non Af Amer: >60 mL/min (ref >60)
GLUCOSE: 84 mg/dL (ref 65–99)
POTASSIUM: 3.9 mmol/L (ref 3.5–5.1)
Sodium: 137 mmol/L (ref 135–145)

## 2014-11-14 LAB — LIPASE, BLOOD: Lipase: 45 U/L (ref 22–51)

## 2014-11-14 LAB — URINALYSIS, ROUTINE W REFLEX MICROSCOPIC
Bilirubin Urine: NEGATIVE
GLUCOSE, UA: NEGATIVE mg/dL
Hgb urine dipstick: NEGATIVE
Ketones, ur: NEGATIVE mg/dL
LEUKOCYTES UA: NEGATIVE
Nitrite: NEGATIVE
PROTEIN: NEGATIVE mg/dL
SPECIFIC GRAVITY, URINE: 1.02 (ref 1.005–1.030)
Urobilinogen, UA: 0.2 mg/dL (ref 0.0–1.0)
pH: 5.5 (ref 5.0–8.0)

## 2014-11-14 LAB — CBC
HEMATOCRIT: 34.3 % — AB (ref 36.0–46.0)
HEMOGLOBIN: 11 g/dL — AB (ref 12.0–15.0)
MCH: 29.4 pg (ref 26.0–34.0)
MCHC: 32.1 g/dL (ref 30.0–36.0)
MCV: 91.7 fL (ref 78.0–100.0)
Platelets: 223 10*3/uL (ref 150–400)
RBC: 3.74 MIL/uL — AB (ref 3.87–5.11)
RDW: 15 % (ref 11.5–15.5)
WBC: 7 10*3/uL (ref 4.0–10.5)

## 2014-11-14 LAB — POC URINE PREG, ED: PREG TEST UR: NEGATIVE

## 2014-11-14 LAB — CBG MONITORING, ED: Glucose-Capillary: 80 mg/dL (ref 65–99)

## 2014-11-14 MED ORDER — HYDROMORPHONE HCL 1 MG/ML IJ SOLN
1.0000 mg | Freq: Once | INTRAMUSCULAR | Status: AC
  Administered 2014-11-14: 1 mg via INTRAVENOUS
  Filled 2014-11-14: qty 1

## 2014-11-14 MED ORDER — IOHEXOL 300 MG/ML  SOLN
100.0000 mL | Freq: Once | INTRAMUSCULAR | Status: AC | PRN
  Administered 2014-11-14: 75 mL via INTRAVENOUS

## 2014-11-14 MED ORDER — SODIUM CHLORIDE 0.9 % IV BOLUS (SEPSIS)
1000.0000 mL | Freq: Once | INTRAVENOUS | Status: AC
  Administered 2014-11-14: 1000 mL via INTRAVENOUS

## 2014-11-14 MED ORDER — HYDROCODONE-ACETAMINOPHEN 5-325 MG PO TABS
1.0000 | ORAL_TABLET | Freq: Four times a day (QID) | ORAL | Status: DC | PRN
Start: 2014-11-14 — End: 2014-11-24

## 2014-11-14 MED ORDER — METOCLOPRAMIDE HCL 5 MG/ML IJ SOLN
10.0000 mg | Freq: Once | INTRAMUSCULAR | Status: AC
  Administered 2014-11-14: 10 mg via INTRAVENOUS
  Filled 2014-11-14: qty 2

## 2014-11-14 MED ORDER — ONDANSETRON HCL 4 MG/2ML IJ SOLN: 4.0000 mg | Freq: Once | INTRAMUSCULAR | Status: DC | PRN

## 2014-11-14 MED ORDER — IBUPROFEN 800 MG PO TABS: 800.0000 mg | ORAL_TABLET | Freq: Three times a day (TID) | ORAL | Status: DC

## 2014-11-14 NOTE — ED Provider Notes (Signed)
CSN: [id number]     Arrival date & time 11/14/14  4098 History   First MD Initiated Contact with Patient 11/14/14 [phone]     Chief Complaint  Patient presents with  . Abdominal Pain  . Loss of Consciousness     (Consider location/radiation/quality/duration/timing/severity/associated sxs/prior Treatment) HPI Comments: History of chronic abdominal pain, states his pain has been going on for, "years." Became worse this morning. Associated syncope and vomiting.  Patient is a 34 y.o. female presenting with abdominal pain and syncope. The history is provided by the patient.  Abdominal Pain Pain location: Lower abdomen. Pain quality: sharp   Pain radiates to:  RLQ and LLQ Pain severity:  Severe Onset quality:  Sudden Timing:  Constant Progression:  Unchanged Chronicity:  Chronic Context: not sick contacts   Relieved by:  Nothing Worsened by:  Nothing tried Associated symptoms: vomiting   Associated symptoms: no cough, no fever and no shortness of breath   Loss of Consciousness Associated symptoms: vomiting   Associated symptoms: no fever and no shortness of breath     Past Medical History  Diagnosis Date  . Multiple gastric ulcers   . Primary fibromyalgia syndrome   . Anemia    Past Surgical History  Procedure Laterality Date  . Gastric bypass    . Cholecystectomy    . Dilation and curettage of uterus    . Gallbladder surgery  2012   Family History  Problem Relation Age of Onset  . Depression Mother   . Pancreatitis Mother   . Hypertension Mother   . Hyperlipidemia Mother   . Depression Father   . Hypertension Father   . Hyperlipidemia Father   . Hypertension Sister   . Depression Brother   . Suicidality Brother    Social History  Substance Use Topics  . Smoking status: Never Smoker   . Smokeless tobacco: None  . Alcohol Use: No   OB History    No data available     Review of Systems  Constitutional: Negative for fever.  Respiratory: Negative for cough  and shortness of breath.   Cardiovascular: Positive for syncope.  Gastrointestinal: Positive for vomiting and abdominal pain.  Neurological: Positive for syncope.  All other systems reviewed and are negative.     Allergies  Review of patient's allergies indicates no known allergies.  Home Medications   Prior to Admission medications   Medication Sig Start Date End Date Taking? Authorizing Provider  cyclobenzaprine (FLEXERIL) 10 MG tablet Take 1 tablet (10 mg total) by mouth 3 (three) times daily as needed for muscle spasms. 11/04/14   Sunnie Nielsen, DO  dicyclomine (BENTYL) 20 MG tablet Take 1 tablet (20 mg total) by mouth 2 (two) times daily. 06/13/14   Glynn Octave, MD  escitalopram (LEXAPRO) 10 MG tablet Take 10 mg by mouth daily.    Historical Provider, MD  ferrous sulfate (FERROUSUL) 325 (65 FE) MG tablet Take 1 tablet (325 mg total) by mouth daily with breakfast. 10/28/14   Sunnie Nielsen, DO  fluconazole (DIFLUCAN) 150 MG tablet Take 1 tablet (150 mg total) by mouth once. 10/04/14   Sunnie Nielsen, DO  gabapentin (NEURONTIN) 300 MG capsule Take 300 mg by mouth 3 (three) times daily as needed. For pain 09/29/14   Historical Provider, MD  gabapentin (NEURONTIN) 600 MG tablet Take 1-2 tablets (600-1,200 mg total) by mouth 3 (three) times daily. DO NOT TAKE MORE THAN 6 PILLS (3600 MG) IN 24 HOURS 10/20/14   Sunnie Nielsen, DO  Iron Polysacch Cmplx-B12-FA 150-0.025-1 MG CAPS Take by mouth.    Historical Provider, MD  ondansetron (ZOFRAN) 4 MG tablet Take 1 tablet (4 mg total) by mouth every 6 (six) hours. 06/13/14   Glynn Octave, MD  pantoprazole (PROTONIX) 40 MG tablet Take 40 mg by mouth daily.    Historical Provider, MD  pregabalin (LYRICA) 150 MG capsule Take 1 capsule (150 mg total) by mouth 2 (two) times daily. 11/03/14   Sunnie Nielsen, DO  Prenatal Vit-Fe Fumarate-FA (PRENATAL MULTIVITAMIN) TABS tablet Take 1 tablet by mouth daily at 12 noon.    Historical Provider,  MD  promethazine (PHENERGAN) 12.5 MG tablet Take 12.5 mg by mouth as needed for nausea or vomiting.    Historical Provider, MD  sucralfate (CARAFATE) 1 G tablet Take 1 tablet (1 g total) by mouth 4 (four) times daily. 03/26/14   Marisa Severin, MD   BP 83/54 mmHg  Pulse 77  Temp(Src) 98.5 F (36.9 C) (Oral)  Resp 14  SpO2 99%  LMP 10/23/2014 Physical Exam  Constitutional: She is oriented to person, place, and time. She appears well-developed and well-nourished. No distress.  HENT:  Head: Normocephalic and atraumatic.  Mouth/Throat: Oropharynx is clear and moist.  Eyes: EOM are normal. Pupils are equal, round, and reactive to light.  Neck: Normal range of motion. Neck supple.  Cardiovascular: Normal rate and regular rhythm.  Exam reveals no friction rub.   No murmur heard. Pulmonary/Chest: Effort normal and breath sounds normal. No respiratory distress. She has no wheezes. She has no rales.  Abdominal: Soft. She exhibits no distension. There is tenderness (diffuse lower abdomen). There is no rebound.  Musculoskeletal: Normal range of motion. She exhibits no edema.  Neurological: She is alert and oriented to person, place, and time.  Skin: No rash noted. She is not diaphoretic.  Nursing note and vitals reviewed.   ED Course  Procedures (including critical care time) Labs Review Labs Reviewed  CBC - Abnormal; Notable for the following:    RBC 3.74 (*)    Hemoglobin 11.0 (*)    HCT 34.3 (*)    All other components within normal limits  URINALYSIS, ROUTINE W REFLEX MICROSCOPIC (NOT AT [facility]) - Abnormal; Notable for the following:    APPearance HAZY (*)    All other components within normal limits  BASIC METABOLIC PANEL  LIPASE, BLOOD  CBG MONITORING, ED  POC URINE PREG, ED    Imaging Review Ct Abdomen Pelvis W Contrast  11/14/2014   CLINICAL DATA:  Sudden onset diffuse abdominal pain.  EXAM: CT ABDOMEN AND PELVIS WITH CONTRAST  TECHNIQUE: Multidetector CT imaging of the abdomen  and pelvis was performed using the standard protocol following bolus administration of intravenous contrast.  CONTRAST:  75mL OMNIPAQUE IOHEXOL 300 MG/ML  SOLN  COMPARISON:  None.  FINDINGS: Lower chest:  Clear lung bases.  Normal heart size.  Hepatobiliary: Normal liver. No focal hepatic mass. Prior cholecystectomy.  Pancreas: Normal.  Spleen: Normal.  Adrenals/Urinary Tract: Normal adrenal glands. Pelvic right kidney. Normal left kidney. No obstructive uropathy or urolithiasis. Normal bladder.  Stomach/Bowel: Prior gastric bypass. No bowel dilatation to suggest obstruction. Moderate amount of stool throughout the colon. No bowel wall thickening, but evaluation is limited secondary to lack of enteric contrast distending the bowel loops. No pneumoperitoneum, pneumatosis or portal venous gas. No abdominal free fluid. Trace pelvic free fluid likely physiologic.  Vascular/Lymphatic: Normal abdominal aorta. No abdominal or pelvic lymphadenopathy.  Reproductive: Normal uterus.  No adnexal mass.  Other: No focal fluid collection or hematoma.  Musculoskeletal: No acute osseous abnormality. No lytic or sclerotic osseous lesion.  IMPRESSION: 1. No acute abdominal or pelvic pathology. 2. Prior gastric bypass and cholecystectomy.   Electronically Signed   By: Elige Ko   On: 11/14/2014 09:59   I have personally reviewed and evaluated these images and lab results as part of my medical decision-making.   EKG Interpretation   Date/Time:  Monday November 14 2014 06:31:18 EDT Ventricular Rate:  106 PR Interval:  150 QRS Duration: 77 QT Interval:  352 QTC Calculation: 467 R Axis:   49 Text Interpretation:  Sinus tachycardia No prior for comparison Confirmed  by Gwendolyn Grant  MD, BLAIR ([id number]) on 11/14/2014 7:07:58 AM      MDM   Final diagnoses:  Abdominal pain, unspecified abdominal location    34 year old female with chronic abdominal pain from gastric bypass and perforated ulcers presents with abdominal pain.  States worse over the past few hours. Pain began wall she was at work. She also began passing out. She was a lower abdominal pain is similar to prior. She did state some mild difficulty urinating but no dysuria, hematuria, frequency. She has had some vomiting today also. Denies any chest pain or shortness of breath or fever. Here she has lower abdominal pain. She is a Engineer, civil (consulting). She has not been scanned in a long time, we'll scan her today and check labs. EKG is normal. CT is ok. Labs ok. Feeling better after fluids and pain meds. Given small amount of pain meds to go home. With chronicity of pain, likely exacerbation of her chronic pain. Stable for discharge, she plans on seeing her PCP soon.    Elwin Mocha, MD 11/14/14 [id number]

## 2014-11-14 NOTE — Discharge Instructions (Signed)
Abdominal Pain Many things can cause abdominal pain. Usually, abdominal pain is not caused by a disease and will improve without treatment. It can often be observed and treated at home. Your health care provider will do a physical exam and possibly order blood tests and X-rays to help determine the seriousness of your pain. However, in many cases, more time must pass before a clear cause of the pain can be found. Before that point, your health care provider may not know if you need more testing or further treatment. HOME CARE INSTRUCTIONS  Monitor your abdominal pain for any changes. The following actions may help to alleviate any discomfort you are experiencing:  Only take over-the-counter or prescription medicines as directed by your health care provider.  Do not take laxatives unless directed to do so by your health care provider.  Try a clear liquid diet (broth, tea, or water) as directed by your health care provider. Slowly move to a bland diet as tolerated. SEEK MEDICAL CARE IF:  You have unexplained abdominal pain.  You have abdominal pain associated with nausea or diarrhea.  You have pain when you urinate or have a bowel movement.  You experience abdominal pain that wakes you in the night.  You have abdominal pain that is worsened or improved by eating food.  You have abdominal pain that is worsened with eating fatty foods.  You have a fever. SEEK IMMEDIATE MEDICAL CARE IF:   Your pain does not go away within 2 hours.  You keep throwing up (vomiting).  Your pain is felt only in portions of the abdomen, such as the right side or the left lower portion of the abdomen.  You pass bloody or black tarry stools. MAKE SURE YOU:  Understand these instructions.   Will watch your condition.   Will get help right away if you are not doing well or get worse.  Document Released: 11/14/2004 Document Revised: 02/09/2013 Document Reviewed: 10/14/2012 [employer] Patient Information  2015 [place], [place]. This information is not intended to replace advice given to you by your health care provider. Make sure you discuss any questions you have with your health care provider.  Abdominal Pain During Pregnancy Abdominal pain is common in pregnancy. Most of the time, it does not cause harm. There are many causes of abdominal pain. Some causes are more serious than others. Some of the causes of abdominal pain in pregnancy are easily diagnosed. Occasionally, the diagnosis takes time to understand. Other times, the cause is not determined. Abdominal pain can be a sign that something is very wrong with the pregnancy, or the pain may have nothing to do with the pregnancy at all. For this reason, always tell your health care provider if you have any abdominal discomfort. HOME CARE INSTRUCTIONS  Monitor your abdominal pain for any changes. The following actions may help to alleviate any discomfort you are experiencing:  Do not have sexual intercourse or put anything in your vagina until your symptoms go away completely.  Get plenty of rest until your pain improves.  Drink clear fluids if you feel nauseous. Avoid solid food as long as you are uncomfortable or nauseous.  Only take over-the-counter or prescription medicine as directed by your health care provider.  Keep all follow-up appointments with your health care provider. SEEK IMMEDIATE MEDICAL CARE IF:  You are bleeding, leaking fluid, or passing tissue from the vagina.  You have increasing pain or cramping.  You have persistent vomiting.  You have painful or bloody  urination.  You have a fever.  You notice a decrease in your baby's movements.  You have extreme weakness or feel faint.  You have shortness of breath, with or without abdominal pain.  You develop a severe headache with abdominal pain.  You have abnormal vaginal discharge with abdominal pain.  You have persistent diarrhea.  You have abdominal pain that  continues even after rest, or gets worse. MAKE SURE YOU:   Understand these instructions.  Will watch your condition.  Will get help right away if you are not doing well or get worse. Document Released: 02/04/2005 Document Revised: 11/25/2012 Document Reviewed: 09/03/2012 [facility] Patient Information 2015 [place]. This information is not intended to replace advice given to you by your health care provider. Make sure you discuss any questions you have with your health care provider.

## 2014-11-14 NOTE — ED Notes (Signed)
Pt hx stomach ulcers with hx 2 gastric bypasses and 1 stomach reconstruction from ulcers. Pt occasionally has hypotensive and hypoglycemic episodes. Pt reports vomiting a couple times a week normally d/t extensive GI issues. Pt reports generalized abd pain beginning around 0300 this morning and began "not feeling right," states she passed out. PT a&o x4 at this time. Generalized abd tenderness and stabbing pain. C/o nausea and dry-heaving.

## 2014-11-14 NOTE — ED Notes (Signed)
Called CT- pt next for scan

## 2014-11-14 NOTE — ED Notes (Signed)
CBG: 80 

## 2014-11-14 NOTE — ED Notes (Signed)
Pt ambulatory w/ steady gait to restroom to obtain urine specimen. 

## 2014-11-22 ENCOUNTER — Telehealth: Payer: Self-pay | Admitting: Osteopathic Medicine

## 2014-11-22 NOTE — Telephone Encounter (Signed)
See telephone encounter.

## 2014-11-22 NOTE — Telephone Encounter (Signed)
Attempted to call patient to evaluate for suicidal ideations after reading her e-mail to me. Phone went straight to voicemail. I left voicemail for her to call the clinic asap. I called [employer] to dispatch a deputy to perform a welfare/safety check.   Furthermore, patient related to me in her email that she went to her pain clinic appt and they were overbooked and needed to reschedule her. I called the clinic to confirm this and they told me she was 90 minutes late to her appointment.

## 2014-11-24 ENCOUNTER — Ambulatory Visit (INDEPENDENT_AMBULATORY_CARE_PROVIDER_SITE_OTHER): Payer: BLUE CROSS/BLUE SHIELD | Admitting: Osteopathic Medicine

## 2014-11-24 ENCOUNTER — Encounter: Payer: Self-pay | Admitting: Osteopathic Medicine

## 2014-11-24 VITALS — BP 102/72 | HR 78 | Wt 154.0 lb

## 2014-11-24 DIAGNOSIS — M797 Fibromyalgia: Secondary | ICD-10-CM

## 2014-11-24 DIAGNOSIS — F419 Anxiety disorder, unspecified: Secondary | ICD-10-CM

## 2014-11-24 DIAGNOSIS — F329 Major depressive disorder, single episode, unspecified: Secondary | ICD-10-CM

## 2014-11-24 DIAGNOSIS — F418 Other specified anxiety disorders: Secondary | ICD-10-CM

## 2014-11-24 MED ORDER — AMITRIPTYLINE HCL 10 MG PO TABS: 10.0000 mg | ORAL_TABLET | Freq: Every day | ORAL | Status: DC

## 2014-11-24 MED ORDER — DICLOFENAC SODIUM 1 % TD GEL: 4.0000 g | Freq: Four times a day (QID) | TRANSDERMAL | Status: AC

## 2014-11-24 MED ORDER — AMITRIPTYLINE HCL 25 MG PO TABS: 25.0000 mg | ORAL_TABLET | Freq: Every day | ORAL | Status: DC

## 2014-11-24 NOTE — Progress Notes (Signed)
HPI: Cheyenne Murillo is a 34 y.o. female who presents to [facility]  today for chief complaint of:  Chief Complaint  Patient presents with  . Anxiety   Lexapro isn't working at this point. She is feeling more irritable and having difficulty sleeping. She has been on Cymbalta before and experienced suicidality. No other changes to ROS form last appointment.   Of note: Recently had appt at pain clinic 3 days ago. Sent message to me after that appt stating they were overbooked and she wrote "I'm on the verge of becoming suicidal at this point with the pain!" I tried to reach her to discuss this, however no answer on phone so I was obligated to call [place] [employer] to do safety check. She is understanding when I explain the situation that anytime I cannot speak with aptient re: suicidality when they mention it in the medical record, I'm going to call police to do safety check. Additionally, I called the pain clinic to confirm the scheduling issues reported by the patient, and I was told she showed up 90 minutes late to her appointment. Please see documentation from 11/22/14. She is not asking for pain medicine today, she states she had this appointment as a followup. Did not broach the subject of pain management appointment.     Past medical, social and family history reviewed: Past Medical History  Diagnosis Date  . Multiple gastric ulcers   . Primary fibromyalgia syndrome   . Anemia    Past Surgical History  Procedure Laterality Date  . Gastric bypass    . Cholecystectomy    . Dilation and curettage of uterus    . Gallbladder surgery  2012   Social History  Substance Use Topics  . Smoking status: Never Smoker   . Smokeless tobacco: Not on file  . Alcohol Use: No   Family History  Problem Relation Age of Onset  . Depression Mother   . Pancreatitis Mother   . Hypertension Mother   . Hyperlipidemia Mother   . Depression Father   .  Hypertension Father   . Hyperlipidemia Father   . Hypertension Sister   . Depression Brother   . Suicidality Brother     Current Outpatient Prescriptions  Medication Sig Dispense Refill  . cyclobenzaprine (FLEXERIL) 10 MG tablet Take 1 tablet (10 mg total) by mouth 3 (three) times daily as needed for muscle spasms. 60 tablet 0  . dicyclomine (BENTYL) 20 MG tablet Take 1 tablet (20 mg total) by mouth 2 (two) times daily. 20 tablet 0  . escitalopram (LEXAPRO) 10 MG tablet Take 10 mg by mouth daily.    Marland Kitchen gabapentin (NEURONTIN) 600 MG tablet Take 1-2 tablets (600-1,200 mg total) by mouth 3 (three) times daily. DO NOT TAKE MORE THAN 6 PILLS (3600 MG) IN 24 HOURS 120 tablet 1  . Iron Polysacch Cmplx-B12-FA 150-0.025-1 MG CAPS Take by mouth.    . ondansetron (ZOFRAN) 4 MG tablet Take 1 tablet (4 mg total) by mouth every 6 (six) hours. 12 tablet 0  . pantoprazole (PROTONIX) 40 MG tablet Take 40 mg by mouth daily.    .   60 capsule 1  . Prenatal Vit-Fe Fumarate-FA (PRENATAL MULTIVITAMIN) TABS tablet Take 1 tablet by mouth daily at 12 noon.    . promethazine (PHENERGAN) 12.5 MG tablet Take 12.5 mg by mouth as needed for nausea or vomiting.    . sucralfate (CARAFATE) 1 G tablet Take 1 tablet (  1 g total) by mouth 4 (four) times daily. 30 tablet 0   No current facility-administered medications for this visit.   No Known Allergies    Review of Systems: CONSTITUTIONAL: Neg fever/chills, no unintentional weight changes, (+) FATIGUE CARDIAC: No chest pain/pressure/palpitations, no orthopnea RESPIRATORY: No cough/shortness of breath/wheeze GASTROINTESTINAL: No nausea/vomiting/abdominal pain/blood in stool/diarrhea/constipation MUSCULOSKELETAL: (+) DIFFUSE AND CHRONIC myalgia/arthralgia SKIN: No rash/wounds/concerning lesions HEM/ONC: No easy bruising/bleeding, no abnormal lymph node, no heavy periods  PSYCH: Reports mood swings, irritably, crying easily, no SI/HI   Exam:  BP 102/72 mmHg  Pulse  78  Wt 154 lb (69.854 kg)  SpO2 100%  LMP 10/23/2014 Constitutional: VSS, see above. General Appearance: alert, well-developed, well-nourished, NAD  Psychiatric: Normal judgment/insight. Somewhat flattened affect. Oriented x3. Depressed Mood. No SI/HI.    No results found for this or any previous visit (from the past 72 hour(s)).    ASSESSMENT/PLAN:  Fibromyalgia syndrome - worsening - Plan: amitriptyline (ELAVIL) 10 MG tablet, amitriptyline (ELAVIL) 25 MG tablet  Anxiety and depression - worsening - see above re: Rx change   Instructions to taper off Lexapro and titrate up on Elavil - printed and reviewed with the patient, all questions answered. Patient has been educated on significant possible side effects of medication and is instructed to contact me or other medical professional with any concerns about side effects. Keep appointment with pain management. RTC any concerns.

## 2014-11-24 NOTE — Patient Instructions (Addendum)
To discontinue Lexapro: decrease to 1 tab daily x 1 week, then 1/2 tab daily x 1 week, then stop altogether. At the same time, to start Amitriptyline: 10 mg tab daily at bedtime x 1 week, then fill Rx for 25 mg tab to take daily x 1 week, then can take 2 of the 25 mg tabs daily indefinitely. This medicine has been shown to help fibromyalgia pain as well as anxiety and depression. Prozac can safely be added to this medicine and has also shown benefit in fibromyalgia, however let's try stopping the Lexapro and seeing if the switch will work so we aren't using more medications than needed.

## 2014-11-29 ENCOUNTER — Other Ambulatory Visit: Payer: Self-pay | Admitting: Osteopathic Medicine

## 2014-11-30 ENCOUNTER — Telehealth: Payer: Self-pay | Admitting: Osteopathic Medicine

## 2014-11-30 ENCOUNTER — Other Ambulatory Visit: Payer: Self-pay | Admitting: *Deleted

## 2014-11-30 DIAGNOSIS — M797 Fibromyalgia: Secondary | ICD-10-CM

## 2014-11-30 MED ORDER — AMITRIPTYLINE HCL 25 MG PO TABS: 50.0000 mg | ORAL_TABLET | Freq: Every day | ORAL | Status: AC

## 2014-11-30 NOTE — Telephone Encounter (Signed)
[place] [employer] [facility] [place] and requested another prior authorization form they will fax it to me and when received I will fill it out and resubmit for authorization. - CF

## 2014-12-01 NOTE — Telephone Encounter (Signed)
[facility] filled it out and faxed it back. Waiting on authorization. - CF

## 2014-12-05 ENCOUNTER — Telehealth: Payer: Self-pay | Admitting: Osteopathic Medicine

## 2014-12-05 NOTE — Telephone Encounter (Signed)
[place] [employer] [place] they have approved  Lyrica from 12/02/2014 - 02/17/9998. Case ID [id number]. - CF

## 2014-12-07 ENCOUNTER — Encounter: Payer: Self-pay | Admitting: Osteopathic Medicine

## 2014-12-08 ENCOUNTER — Other Ambulatory Visit: Payer: Self-pay | Admitting: Osteopathic Medicine

## 2014-12-08 ENCOUNTER — Other Ambulatory Visit: Payer: BLUE CROSS/BLUE SHIELD | Admitting: Osteopathic Medicine

## 2014-12-08 MED ORDER — BUSPIRONE HCL 5 MG PO TABS: 5.0000 mg | ORAL_TABLET | Freq: Three times a day (TID) | ORAL | Status: DC

## 2014-12-09 ENCOUNTER — Other Ambulatory Visit: Payer: Self-pay | Admitting: Osteopathic Medicine

## 2015-01-04 ENCOUNTER — Other Ambulatory Visit: Payer: Self-pay

## 2015-01-04 MED ORDER — CYCLOBENZAPRINE HCL 10 MG PO TABS: 10.0000 mg | ORAL_TABLET | Freq: Three times a day (TID) | ORAL | Status: AC | PRN

## 2015-01-10 ENCOUNTER — Other Ambulatory Visit: Payer: Self-pay | Admitting: Osteopathic Medicine

## 2015-01-10 MED ORDER — BUSPIRONE HCL 5 MG PO TABS: 5.0000 mg | ORAL_TABLET | Freq: Three times a day (TID) | ORAL | Status: DC

## 2015-02-05 ENCOUNTER — Encounter: Payer: Self-pay | Admitting: Osteopathic Medicine

## 2015-02-06 NOTE — Telephone Encounter (Signed)
Please call patient: I am not comfortable prescribing Flexeril plus the Lyrica - if pain management is prescribing both these then she needs to contact them about refills. I have already denied several pharmacy requests for refills.

## 2015-02-22 ENCOUNTER — Other Ambulatory Visit: Payer: Self-pay

## 2015-02-22 MED ORDER — BUSPIRONE HCL 5 MG PO TABS: 5.0000 mg | ORAL_TABLET | Freq: Three times a day (TID) | ORAL | Status: AC

## 2016-07-03 IMAGING — CT CT ABD-PELV W/ CM
2 of 4 series · 15 of 46 positions shown, 17 images · IV contrast (Omni 300)
Comparison: None.

CLINICAL DATA: Sudden onset diffuse abdominal pain.

EXAM:
CT ABDOMEN AND PELVIS WITH CONTRAST
TECHNIQUE: Multidetector CT imaging of the abdomen and pelvis was performed
using the standard protocol following bolus administration of
intravenous contrast.
CONTRAST:  75mL OMNIPAQUE IOHEXOL 300 MG/ML  SOLN

[Series 3: a/p w/ 5mm · axial · 0.72mm/px · z∈[+671,+1111]mm · 12 of 97 slices shown, 14 images]
[im 5/97  soft-tissue]
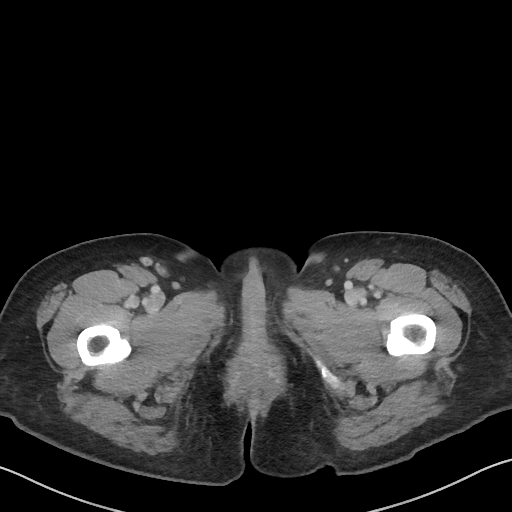
[im 5/97  bone]
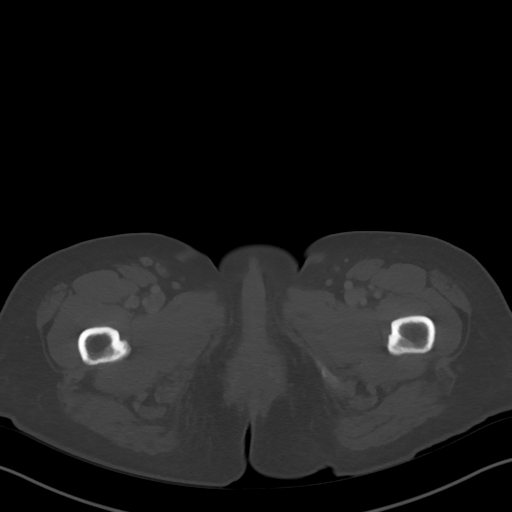
[im 13/97  soft-tissue]
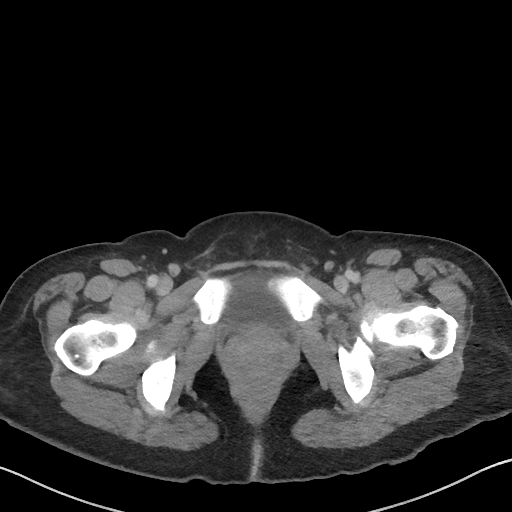
[im 21/97  soft-tissue]
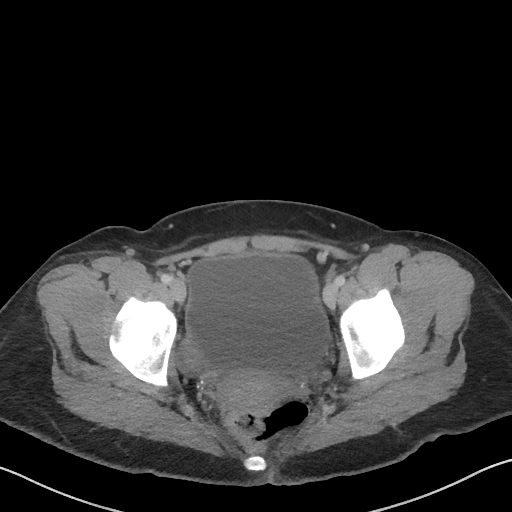
[im 29/97  soft-tissue]
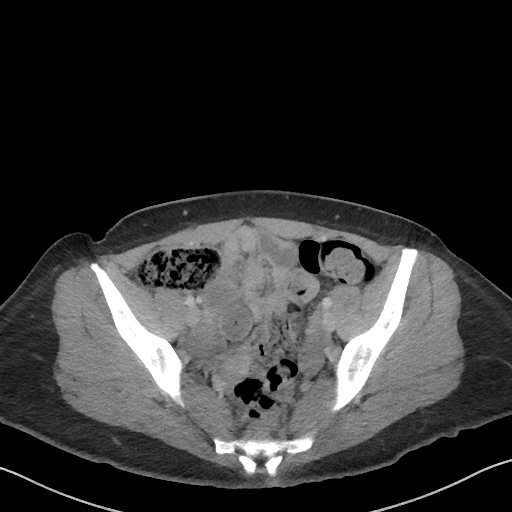
[im 37/97  soft-tissue]
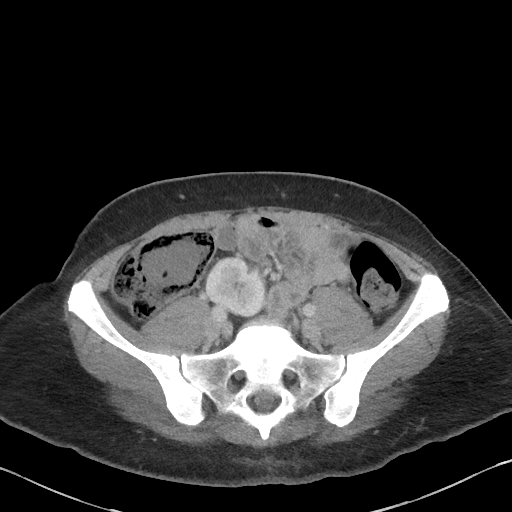
[im 45/97  soft-tissue]
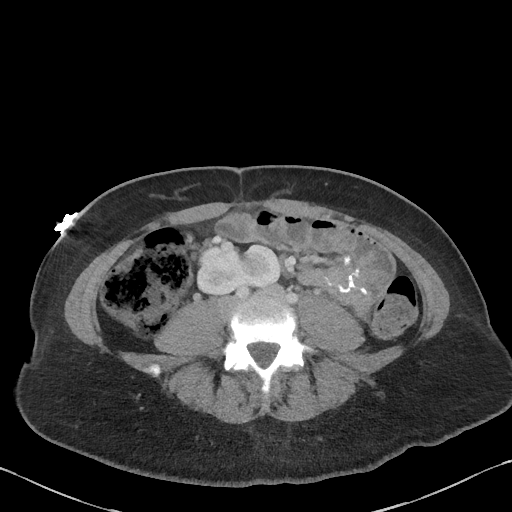
[im 53/97  soft-tissue]
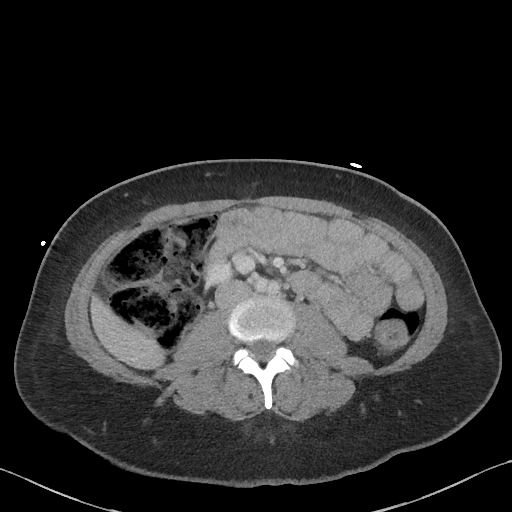
[im 61/97  soft-tissue]
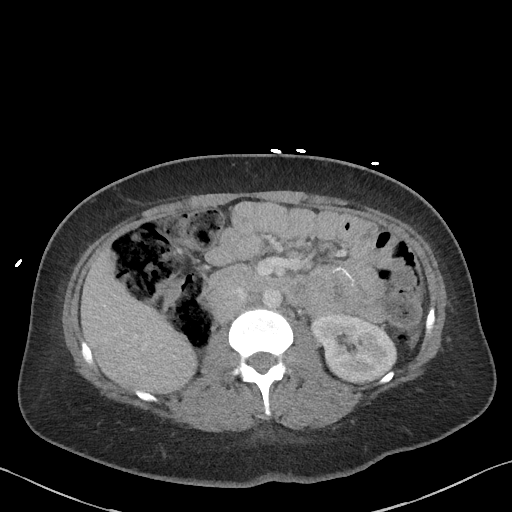
[im 69/97  soft-tissue]
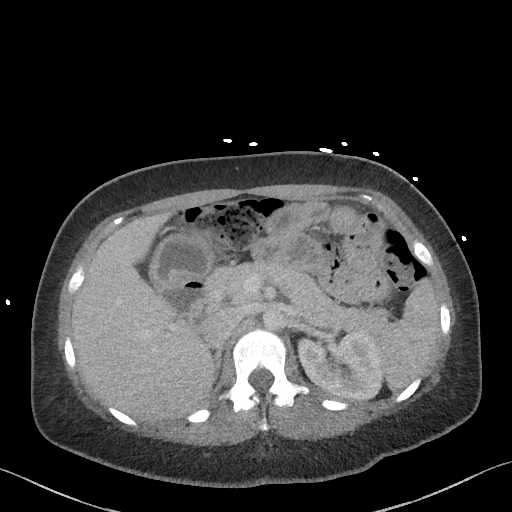
[im 69/97  bone]
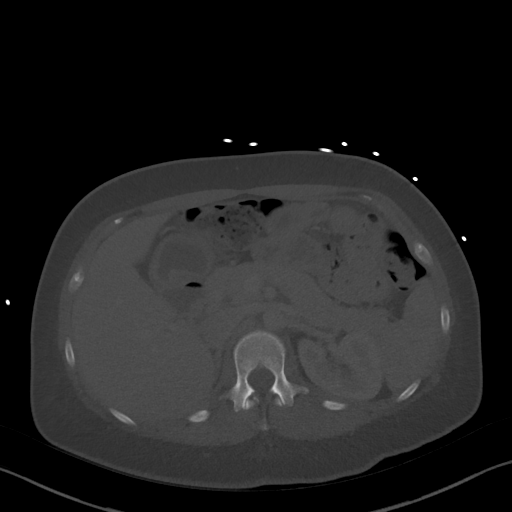
[im 77/97  soft-tissue]
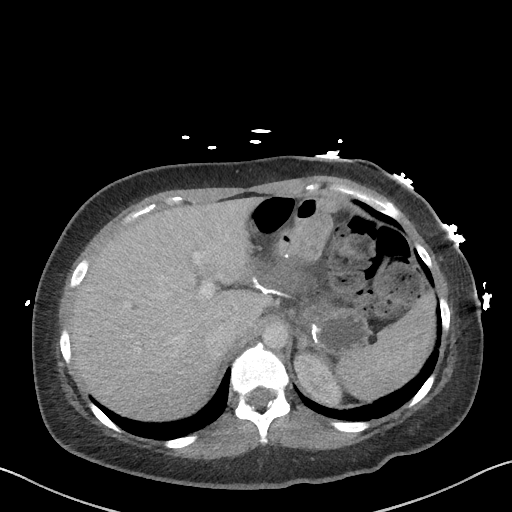
[im 85/97  soft-tissue]
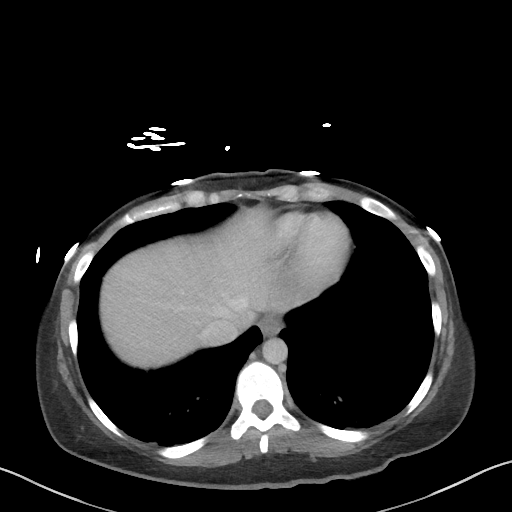
[im 93/97  soft-tissue]
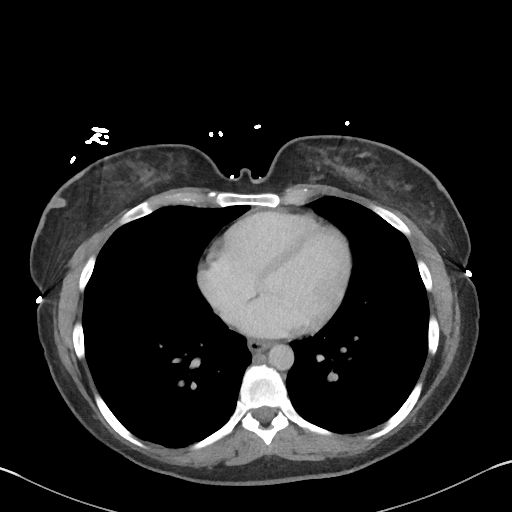

[Series 6: a/p w/ cor · coronal · 0.65mm/px · 3 of 115 slices shown]
[im 39/115  soft-tissue]
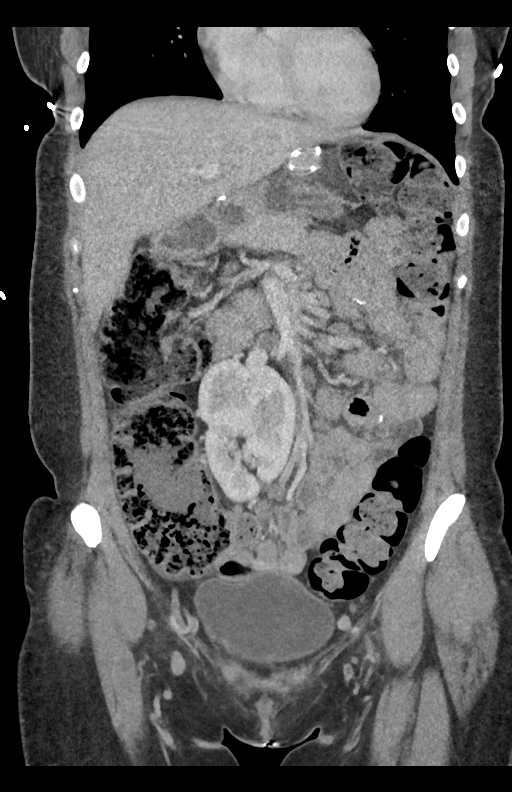
[im 51/115  soft-tissue]
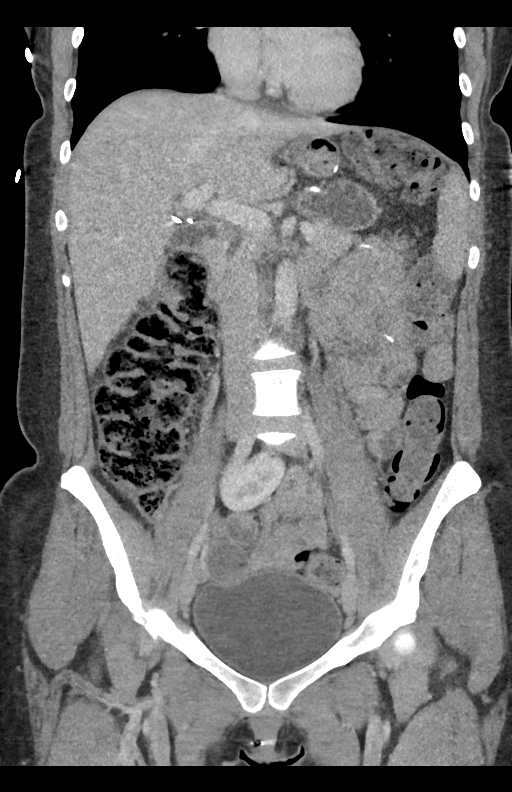
[im 64/115  soft-tissue]
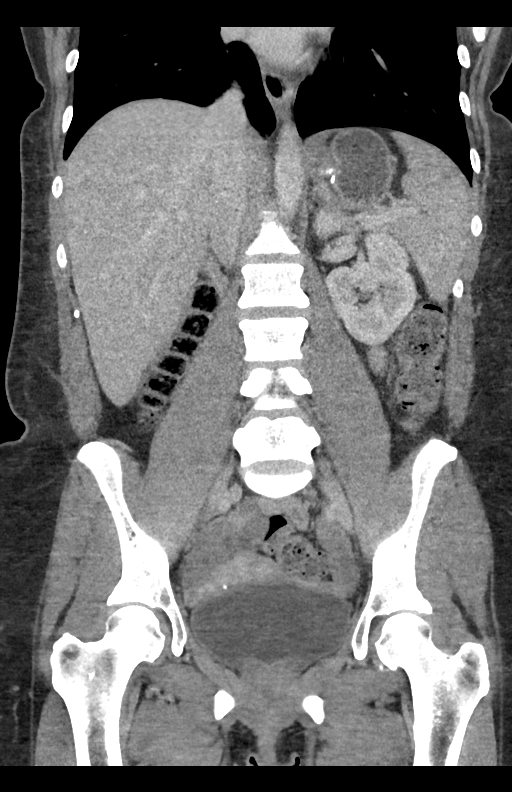

[15 of 46 positions shown; findings below may reference images not displayed]

FINDINGS: Lower chest:  Clear lung bases.  Normal heart size.

Hepatobiliary: Normal liver. No focal hepatic mass. Prior
cholecystectomy.

Pancreas: Normal.

Spleen: Normal.

Adrenals/Urinary Tract: Normal adrenal glands. Pelvic right kidney.
Normal left kidney. No obstructive uropathy or urolithiasis. Normal
bladder.

Stomach/Bowel: Prior gastric bypass. No bowel dilatation to suggest
obstruction. Moderate amount of stool throughout the colon. No bowel
wall thickening, but evaluation is limited secondary to lack of
enteric contrast distending the bowel loops. No pneumoperitoneum,
pneumatosis or portal venous gas. No abdominal free fluid. Trace
pelvic free fluid likely physiologic.

Vascular/Lymphatic: Normal abdominal aorta. No abdominal or pelvic
lymphadenopathy.

Reproductive: Normal uterus.  No adnexal mass.

Other: No focal fluid collection or hematoma.

Musculoskeletal: No acute osseous abnormality. No lytic or sclerotic
osseous lesion.
IMPRESSION: 1. No acute abdominal or pelvic pathology.
2. Prior gastric bypass and cholecystectomy.

## 2024-03-21 DEATH — deceased
